# Patient Record
Sex: Female | Born: 1950
Health system: Southern US, Community
[De-identification: ages and names within clinical notes are randomized; demographics above are authoritative.]

## PROBLEM LIST (undated history)

## (undated) DIAGNOSIS — E7800 Pure hypercholesterolemia, unspecified: Secondary | ICD-10-CM

## (undated) DIAGNOSIS — I2510 Atherosclerotic heart disease of native coronary artery without angina pectoris: Secondary | ICD-10-CM

---

## 1999-06-23 ENCOUNTER — Encounter: Admission: RE | Admit: 1999-06-23 | Discharge: 1999-06-23 | Payer: Self-pay | Admitting: Orthopedic Surgery

## 1999-06-23 ENCOUNTER — Encounter: Payer: Self-pay | Admitting: Orthopedic Surgery

## 1999-07-09 ENCOUNTER — Ambulatory Visit (HOSPITAL_COMMUNITY): Admission: RE | Admit: 1999-07-09 | Discharge: 1999-07-09 | Payer: Self-pay | Admitting: Orthopedic Surgery

## 1999-11-02 ENCOUNTER — Emergency Department (HOSPITAL_COMMUNITY): Admission: EM | Admit: 1999-11-02 | Discharge: 1999-11-02 | Payer: Self-pay | Admitting: Emergency Medicine

## 1999-11-02 ENCOUNTER — Encounter: Payer: Self-pay | Admitting: Emergency Medicine

## 1999-12-01 ENCOUNTER — Encounter: Payer: Self-pay | Admitting: General Surgery

## 1999-12-01 ENCOUNTER — Encounter (INDEPENDENT_AMBULATORY_CARE_PROVIDER_SITE_OTHER): Payer: Self-pay | Admitting: Specialist

## 1999-12-01 ENCOUNTER — Observation Stay (HOSPITAL_COMMUNITY): Admission: RE | Admit: 1999-12-01 | Discharge: 1999-12-02 | Payer: Self-pay | Admitting: General Surgery

## 2002-03-04 ENCOUNTER — Emergency Department (HOSPITAL_COMMUNITY): Admission: EM | Admit: 2002-03-04 | Discharge: 2002-03-04 | Payer: Self-pay | Admitting: Emergency Medicine

## 2002-03-04 ENCOUNTER — Encounter: Payer: Self-pay | Admitting: Emergency Medicine

## 2008-09-30 ENCOUNTER — Encounter: Admission: RE | Admit: 2008-09-30 | Discharge: 2008-09-30 | Payer: Self-pay | Admitting: Family Medicine

## 2010-10-22 NOTE — Op Note (Signed)
[place]  Patient:    [name], [name]                      MRN: [id number] Proc. Date: 12/01/99 Adm. Date:  [id number] Disc. Date: [id number] Attending:  Glenna Fellows Tappan                           Operative Report  PREOPERATIVE DIAGNOSIS:  Symptomatic cholelithiasis.  POSTOPERATIVE DIAGNOSIS:  Symptomatic cholelithiasis.  SURGICAL PROCEDURE:  Laparoscopic cholecystectomy.  SURGEON:  Lorne Skeens. Hoxworth, M.D.  ASSISTANT:  Donnie Coffin. Samuella Cota, M.D.  ANESTHESIA:  General.  BRIEF HISTORY:  Anne Gibbs is a 60 year old white female who recently presented to the emergency room with an acute episode of epigastric and right flank pain.  A CT of the abdomen and chest was obtained at that time which revealed cholelithiasis and no other abnormalities.  The LFTs were normal. The patient was felt to have symptomatic cholelithiasis and laparoscopic cholecystectomy has been recommended and accepted.  The nature of the procedure, its indications and risks of bleeding, infection, bile leak and bile duct injury were discussed and understood preoperatively.  She is now brought to the operating room for this procedure.  DESCRIPTION OF OPERATION:  Patient was brought to the operating room and placed in supine position on the operating table and general endotracheal anesthesia was induced.  Broad-spectrum antibiotics had been given preoperatively.  The abdomen was sterilely prepped and draped.  PAS were in place.  Local anesthetic was used to infiltrate the trocar sites prior to the incisions.  A 1-cm incision was made at the umbilicus and dissection carried down to the midline fascia.  This was sharply incised for 1 cm and the peritoneum entered under direct vision.  Through a mattress suture of 0 Vicryl, the Hasson trocar was placed and pneumoperitoneum established. Under direct vision, a 10-mm trocar was placed in the subxiphoid area and two 5-mm  trocars along the right subcostal margin.  There were some adhesions of the omentum to the anterior abdominal wall and the right upper quadrant along the inferior edge of the right lobe of the liver.  These were taken down sharply and the gallbladder was exposed.  The fundus was grasped and elevated up over the liver and the infundibulum retracted inferolaterally.  Fibrofatty tissue was stripped from around the neck of the gallbladder toward the porta hepatis.  The distal gallbladder and Calots triangle were thoroughly dissected.  An anterior branch of the cystic artery was seen to be coursing up onto the gallbladder wall and when the anatomy was clear, it was doubly clipped proximally, clipped distally and divided.  The cystic duct was dissected free over about a centimeter and the cystic duct-gallbladder junction dissected 360 degrees.  Calots triangle was dissected well up onto the posterior gallbladder wall and a small posterior branch of the cystic artery was the only structure remaining.  When the anatomy was clear, the cystic duct was doubly clipped proximally, clipped distally and divided.  The posterior branch of the cystic artery was similarly clipped and divided.  The gallbladder was then dissected free from its bed using hook-and-spatula cautery and removed intact through the umbilicus.  The right upper quadrant was irrigated and suctioned till clear and hemostasis assured.  Trocars were removed under direct vision and all CO2 evacuated from the peritoneal cavity. The pursestring suture was secured at the umbilicus.  Skin incisions  were closed with interrupted subcuticulars of 4-0 Monocryl and Steri-Strips. Sponge, needle and instrument count were correct.  Dry sterile dressings were applied and the patient taken to recovery in good condition.DD:  12/01/99 TD:  12/02/99 Job: [id number]

## 2014-01-15 ENCOUNTER — Encounter: Payer: Self-pay | Admitting: Podiatrist

## 2014-01-15 ENCOUNTER — Ambulatory Visit (INDEPENDENT_AMBULATORY_CARE_PROVIDER_SITE_OTHER): Payer: BC Managed Care – PPO | Admitting: Podiatrist

## 2014-01-15 ENCOUNTER — Ambulatory Visit (INDEPENDENT_AMBULATORY_CARE_PROVIDER_SITE_OTHER): Payer: BC Managed Care – PPO

## 2014-01-15 VITALS — BP 141/74 | HR 72 | Resp 18

## 2014-01-15 DIAGNOSIS — R52 Pain, unspecified: Secondary | ICD-10-CM

## 2014-01-15 DIAGNOSIS — S92919A Unspecified fracture of unspecified toe(s), initial encounter for closed fracture: Secondary | ICD-10-CM

## 2014-01-15 DIAGNOSIS — M2030 Hallux varus (acquired), unspecified foot: Secondary | ICD-10-CM

## 2014-01-15 DIAGNOSIS — S92911A Unspecified fracture of right toe(s), initial encounter for closed fracture: Secondary | ICD-10-CM

## 2014-01-15 NOTE — Patient Instructions (Addendum)
You have a Hallux Varus Deformity-- due to the severity of the deformity a Fusion of the great toe joint is the best option to treat this.  You will need to keep complete weight off of your foot 4 weeks after surgery and will be in a boot or shoe up to 8 weeks post operatively.  Please call if you would like to schedule the surgery-- it is performed outpatient at [facility].   Toe Fracture Your caregiver has diagnosed you as having a fractured toe. A toe fracture is a break in the bone of a toe. "Buddy taping" is a way of splinting your broken toe, by taping the broken toe to the toe next to it. This "buddy taping" will keep the injured toe from moving beyond normal range of motion. Buddy taping also helps the toe heal in a more normal alignment. It may take 6 to 8 weeks for the toe injury to heal. [place] your toes taped together for as long as directed by your caregiver or until you see a doctor for a follow-up examination. You can change the tape after bathing. Always use a small piece of gauze or cotton between the toes when taping them together. This will help the skin stay dry and prevent infection.  Apply ice to the injury for 15-20 minutes each hour while awake for the first 2 days. Put the ice in a plastic bag and place a towel between the bag of ice and your skin.  After the first 2 days, apply heat to the injured area. Use heat for the next 2 to 3 days. Place a heating pad on the foot or soak the foot in warm water as directed by your caregiver.  Keep your foot elevated as much as possible to lessen swelling.  Wear sturdy, supportive shoes. The shoes should not pinch the toes or fit tightly against the toes.  Your caregiver may prescribe a rigid shoe if your foot is very swollen.  Your may be given crutches if the pain is too great and it hurts too much to walk.  Only take over-the-counter or prescription medicines for pain,  discomfort, or fever as directed by your caregiver.  If your caregiver has given you a follow-up appointment, it is very important to keep that appointment. Not keeping the appointment could result in a chronic or permanent injury, pain, and disability. If there is any problem keeping the appointment, you must call back to this facility for assistance. SEEK MEDICAL CARE IF:   You have increased pain or swelling, not relieved with medications.  The pain does not get better after 1 week.  Your injured toe is cold when the others are warm. SEEK IMMEDIATE MEDICAL CARE IF:   The toe becomes cold, numb, or white.  The toe becomes hot (inflamed) and red. Document Released: 05/20/2000 Document Revised: 08/15/2011 Document Reviewed: 01/07/2008 [facility] Patient Information 2015 [place], [place]. This information is not intended to replace advice given to you by your health care provider. Make sure you discuss any questions you have with your health care provider.

## 2014-01-15 NOTE — Progress Notes (Signed)
   Subjective:    Patient ID: Anne Gibbs, female    DOB: 1951-02-03, 63 y.o.   MRN: [id number]  HPImy right big toe is turning out and has been going on for about a year and rubs against shoes and my little toe stumped it on the end of the bed and was about a month ago and it was black and the side of foot is sore    Review of Systems  All other systems reviewed and are negative.      Objective:   Physical Exam  Patient is awake, alert, and oriented x 3.  In no acute distress.  Vascular status is intact with palpable pedal pulses at 2/4 DP and PT bilateral and capillary refill time within normal limits. Neurological sensation is also intact bilaterally via Semmes Weinstein monofilament at 5/5 sites. Light touch, vibratory sensation, Achilles tendon reflex is intact. Dermatological exam reveals skin color, turger and texture as normal. No open lesions present.  Musculature intact with dorsiflexion, plantarflexion, inversion, eversion.  Swelling at the fifth digit of the right foot is noted. Severe hallux varus deformity is noted with medial deviation of lesser digits. Pain with range of motion is also noted. X-ray show severe hallux varus deformity and a fracture of the fifth digit of the right foot.    Assessment & Plan:  Hallux varus deformity, fracture fifth digit right foot nondisplaced  Plan: Recommended that she continue to ice the right foot at the fifth digit. We discussed the hallux varus deformity and I recommended a fusion of the first metatarsophalangeal joint. We discussed the specifics of the postoperative course and that she would need to take off of work 4-8 weeks for healing. She would like to consider her options and she will call if she would like to pursue surgery.

## 2014-03-19 ENCOUNTER — Encounter: Payer: Self-pay | Admitting: *Deleted

## 2014-07-11 ENCOUNTER — Encounter (HOSPITAL_COMMUNITY): Payer: Self-pay | Admitting: *Deleted

## 2014-07-11 ENCOUNTER — Emergency Department (HOSPITAL_COMMUNITY): Payer: BLUE CROSS/BLUE SHIELD

## 2014-07-11 ENCOUNTER — Emergency Department (HOSPITAL_COMMUNITY)
Admission: EM | Admit: 2014-07-11 | Discharge: 2014-07-11 | Disposition: A | Discharge status: Home / Self Care | Payer: BLUE CROSS/BLUE SHIELD | Attending: Emergency Medicine | Admitting: Emergency Medicine

## 2014-07-11 DIAGNOSIS — S29001A Unspecified injury of muscle and tendon of front wall of thorax, initial encounter: Secondary | ICD-10-CM | POA: Diagnosis present

## 2014-07-11 DIAGNOSIS — S20212A Contusion of left front wall of thorax, initial encounter: Secondary | ICD-10-CM | POA: Diagnosis not present

## 2014-07-11 DIAGNOSIS — E780 Pure hypercholesterolemia: Secondary | ICD-10-CM | POA: Insufficient documentation

## 2014-07-11 DIAGNOSIS — Y998 Other external cause status: Secondary | ICD-10-CM | POA: Diagnosis not present

## 2014-07-11 DIAGNOSIS — F419 Anxiety disorder, unspecified: Secondary | ICD-10-CM | POA: Insufficient documentation

## 2014-07-11 DIAGNOSIS — Z79899 Other long term (current) drug therapy: Secondary | ICD-10-CM | POA: Diagnosis not present

## 2014-07-11 DIAGNOSIS — Y9389 Activity, other specified: Secondary | ICD-10-CM | POA: Insufficient documentation

## 2014-07-11 DIAGNOSIS — Y92410 Unspecified street and highway as the place of occurrence of the external cause: Secondary | ICD-10-CM | POA: Insufficient documentation

## 2014-07-11 HISTORY — DX: Pure hypercholesterolemia, unspecified: E78.00

## 2014-07-11 MED ORDER — ACETAMINOPHEN 325 MG PO TABS
650.0000 mg | ORAL_TABLET | Freq: Once | ORAL | Status: AC
  Administered 2014-07-11: 650 mg via ORAL
  Filled 2014-07-11: qty 2

## 2014-07-11 MED ORDER — HYDROCODONE-ACETAMINOPHEN 5-325 MG PO TABS: 1.0000 | ORAL_TABLET | Freq: Four times a day (QID) | ORAL | Status: DC | PRN

## 2014-07-11 MED ORDER — CYCLOBENZAPRINE HCL 10 MG PO TABS: 5.0000 mg | ORAL_TABLET | Freq: Two times a day (BID) | ORAL | Status: DC | PRN
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
## 2014-11-07 ENCOUNTER — Other Ambulatory Visit: Payer: Self-pay | Admitting: Obstetrics and Gynecology

## 2014-11-07 DIAGNOSIS — R928 Other abnormal and inconclusive findings on diagnostic imaging of breast: Secondary | ICD-10-CM

## 2014-11-24 ENCOUNTER — Ambulatory Visit
Admission: RE | Admit: 2014-11-24 | Discharge: 2014-11-24 | Disposition: A | Discharge status: Home / Self Care | Payer: BLUE CROSS/BLUE SHIELD | Source: Ambulatory Visit | Attending: Obstetrics and Gynecology | Admitting: Obstetrics and Gynecology

## 2014-11-24 ENCOUNTER — Other Ambulatory Visit: Payer: Self-pay | Admitting: Obstetrics and Gynecology

## 2014-11-24 DIAGNOSIS — R928 Other abnormal and inconclusive findings on diagnostic imaging of breast: Secondary | ICD-10-CM

## 2014-11-24 DIAGNOSIS — N6320 Unspecified lump in the left breast, unspecified quadrant: Secondary | ICD-10-CM

## 2014-11-25 ENCOUNTER — Other Ambulatory Visit: Payer: Self-pay | Admitting: Obstetrics and Gynecology

## 2014-11-25 DIAGNOSIS — N6320 Unspecified lump in the left breast, unspecified quadrant: Secondary | ICD-10-CM

## 2014-11-27 ENCOUNTER — Other Ambulatory Visit: Payer: Self-pay | Admitting: Obstetrics and Gynecology

## 2014-11-27 ENCOUNTER — Ambulatory Visit
Admission: RE | Admit: 2014-11-27 | Discharge: 2014-11-27 | Disposition: A | Discharge status: Home / Self Care | Payer: BLUE CROSS/BLUE SHIELD | Source: Ambulatory Visit | Attending: Obstetrics and Gynecology | Admitting: Obstetrics and Gynecology

## 2014-11-27 DIAGNOSIS — N6320 Unspecified lump in the left breast, unspecified quadrant: Secondary | ICD-10-CM

## 2015-06-07 HISTORY — PX: BREAST BIOPSY: SHX20

## 2015-10-26 DIAGNOSIS — E782 Mixed hyperlipidemia: Secondary | ICD-10-CM | POA: Diagnosis not present

## 2015-10-26 DIAGNOSIS — R6889 Other general symptoms and signs: Secondary | ICD-10-CM | POA: Diagnosis not present

## 2015-10-26 DIAGNOSIS — M1712 Unilateral primary osteoarthritis, left knee: Secondary | ICD-10-CM | POA: Diagnosis not present

## 2015-11-06 DIAGNOSIS — E785 Hyperlipidemia, unspecified: Secondary | ICD-10-CM | POA: Diagnosis not present

## 2015-11-06 DIAGNOSIS — R9430 Abnormal result of cardiovascular function study, unspecified: Secondary | ICD-10-CM | POA: Diagnosis not present

## 2015-11-06 DIAGNOSIS — Z8249 Family history of ischemic heart disease and other diseases of the circulatory system: Secondary | ICD-10-CM | POA: Diagnosis not present

## 2015-11-06 DIAGNOSIS — I739 Peripheral vascular disease, unspecified: Secondary | ICD-10-CM | POA: Diagnosis not present

## 2016-02-26 DIAGNOSIS — Z23 Encounter for immunization: Secondary | ICD-10-CM | POA: Diagnosis not present

## 2016-03-28 DIAGNOSIS — Z1231 Encounter for screening mammogram for malignant neoplasm of breast: Secondary | ICD-10-CM | POA: Diagnosis not present

## 2016-03-28 DIAGNOSIS — N816 Rectocele: Secondary | ICD-10-CM | POA: Diagnosis not present

## 2016-03-28 DIAGNOSIS — N8111 Cystocele, midline: Secondary | ICD-10-CM | POA: Diagnosis not present

## 2016-03-28 DIAGNOSIS — Z1389 Encounter for screening for other disorder: Secondary | ICD-10-CM | POA: Diagnosis not present

## 2016-03-28 DIAGNOSIS — Z130 Encounter for screening for diseases of the blood and blood-forming organs and certain disorders involving the immune mechanism: Secondary | ICD-10-CM | POA: Diagnosis not present

## 2016-03-28 DIAGNOSIS — Z124 Encounter for screening for malignant neoplasm of cervix: Secondary | ICD-10-CM | POA: Diagnosis not present

## 2016-03-28 DIAGNOSIS — Z01419 Encounter for gynecological examination (general) (routine) without abnormal findings: Secondary | ICD-10-CM | POA: Diagnosis not present

## 2016-03-30 ENCOUNTER — Other Ambulatory Visit: Payer: Self-pay | Admitting: Obstetrics and Gynecology

## 2016-03-30 DIAGNOSIS — R928 Other abnormal and inconclusive findings on diagnostic imaging of breast: Secondary | ICD-10-CM

## 2016-04-07 ENCOUNTER — Ambulatory Visit
Admission: RE | Admit: 2016-04-07 | Discharge: 2016-04-07 | Disposition: A | Discharge status: Home / Self Care | Payer: Medicare Other | Source: Ambulatory Visit | Attending: Obstetrics and Gynecology | Admitting: Obstetrics and Gynecology

## 2016-04-07 ENCOUNTER — Other Ambulatory Visit: Payer: Self-pay | Admitting: Obstetrics and Gynecology

## 2016-04-07 DIAGNOSIS — R928 Other abnormal and inconclusive findings on diagnostic imaging of breast: Secondary | ICD-10-CM

## 2016-04-07 DIAGNOSIS — R921 Mammographic calcification found on diagnostic imaging of breast: Secondary | ICD-10-CM | POA: Diagnosis not present

## 2016-04-08 ENCOUNTER — Other Ambulatory Visit: Payer: Self-pay | Admitting: Obstetrics and Gynecology

## 2016-04-08 ENCOUNTER — Ambulatory Visit
Admission: RE | Admit: 2016-04-08 | Discharge: 2016-04-08 | Disposition: A | Discharge status: Home / Self Care | Payer: Medicare Other | Source: Ambulatory Visit | Attending: Obstetrics and Gynecology | Admitting: Obstetrics and Gynecology

## 2016-04-08 DIAGNOSIS — R928 Other abnormal and inconclusive findings on diagnostic imaging of breast: Secondary | ICD-10-CM

## 2016-04-08 DIAGNOSIS — N6012 Diffuse cystic mastopathy of left breast: Secondary | ICD-10-CM | POA: Diagnosis not present

## 2016-04-08 DIAGNOSIS — N641 Fat necrosis of breast: Secondary | ICD-10-CM | POA: Diagnosis not present

## 2016-04-08 DIAGNOSIS — R921 Mammographic calcification found on diagnostic imaging of breast: Secondary | ICD-10-CM | POA: Diagnosis not present

## 2016-04-19 ENCOUNTER — Ambulatory Visit (INDEPENDENT_AMBULATORY_CARE_PROVIDER_SITE_OTHER)
Admission: RE | Admit: 2016-04-19 | Discharge: 2016-04-19 | Disposition: A | Discharge status: Home / Self Care | Payer: Medicare Other | Source: Ambulatory Visit | Attending: Vascular Surgery | Admitting: Vascular Surgery

## 2016-04-19 ENCOUNTER — Ambulatory Visit (HOSPITAL_COMMUNITY)
Admission: RE | Admit: 2016-04-19 | Discharge: 2016-04-19 | Disposition: A | Discharge status: Home / Self Care | Payer: Medicare Other | Source: Ambulatory Visit | Attending: Vascular Surgery | Admitting: Vascular Surgery

## 2016-04-19 ENCOUNTER — Other Ambulatory Visit: Payer: Self-pay | Admitting: Cardiology

## 2016-04-19 DIAGNOSIS — I739 Peripheral vascular disease, unspecified: Secondary | ICD-10-CM | POA: Insufficient documentation

## 2016-05-02 DIAGNOSIS — Z13820 Encounter for screening for osteoporosis: Secondary | ICD-10-CM | POA: Diagnosis not present

## 2016-05-02 DIAGNOSIS — Z23 Encounter for immunization: Secondary | ICD-10-CM | POA: Diagnosis not present

## 2016-05-02 DIAGNOSIS — R6889 Other general symptoms and signs: Secondary | ICD-10-CM | POA: Diagnosis not present

## 2016-05-02 DIAGNOSIS — E782 Mixed hyperlipidemia: Secondary | ICD-10-CM | POA: Diagnosis not present

## 2016-05-02 DIAGNOSIS — M1712 Unilateral primary osteoarthritis, left knee: Secondary | ICD-10-CM | POA: Diagnosis not present

## 2016-06-21 DIAGNOSIS — M8588 Other specified disorders of bone density and structure, other site: Secondary | ICD-10-CM | POA: Diagnosis not present

## 2016-06-21 DIAGNOSIS — Z13820 Encounter for screening for osteoporosis: Secondary | ICD-10-CM | POA: Diagnosis not present

## 2016-06-21 DIAGNOSIS — Z780 Asymptomatic menopausal state: Secondary | ICD-10-CM | POA: Diagnosis not present

## 2016-11-15 DIAGNOSIS — Z1211 Encounter for screening for malignant neoplasm of colon: Secondary | ICD-10-CM | POA: Diagnosis not present

## 2016-11-15 DIAGNOSIS — R030 Elevated blood-pressure reading, without diagnosis of hypertension: Secondary | ICD-10-CM | POA: Diagnosis not present

## 2016-11-15 DIAGNOSIS — Z1389 Encounter for screening for other disorder: Secondary | ICD-10-CM | POA: Diagnosis not present

## 2016-11-15 DIAGNOSIS — K219 Gastro-esophageal reflux disease without esophagitis: Secondary | ICD-10-CM | POA: Diagnosis not present

## 2016-11-15 DIAGNOSIS — Z0000 Encounter for general adult medical examination without abnormal findings: Secondary | ICD-10-CM | POA: Diagnosis not present

## 2016-11-15 DIAGNOSIS — F439 Reaction to severe stress, unspecified: Secondary | ICD-10-CM | POA: Diagnosis not present

## 2016-11-15 DIAGNOSIS — G4730 Sleep apnea, unspecified: Secondary | ICD-10-CM | POA: Diagnosis not present

## 2016-11-15 DIAGNOSIS — Z1231 Encounter for screening mammogram for malignant neoplasm of breast: Secondary | ICD-10-CM | POA: Diagnosis not present

## 2016-11-15 DIAGNOSIS — E559 Vitamin D deficiency, unspecified: Secondary | ICD-10-CM | POA: Diagnosis not present

## 2016-11-15 DIAGNOSIS — M8580 Other specified disorders of bone density and structure, unspecified site: Secondary | ICD-10-CM | POA: Diagnosis not present

## 2016-11-15 DIAGNOSIS — E782 Mixed hyperlipidemia: Secondary | ICD-10-CM | POA: Diagnosis not present

## 2016-11-21 ENCOUNTER — Other Ambulatory Visit: Payer: Self-pay | Admitting: Obstetrics and Gynecology

## 2016-11-21 DIAGNOSIS — R921 Mammographic calcification found on diagnostic imaging of breast: Secondary | ICD-10-CM

## 2016-11-21 DIAGNOSIS — Z1231 Encounter for screening mammogram for malignant neoplasm of breast: Secondary | ICD-10-CM

## 2016-12-05 ENCOUNTER — Ambulatory Visit
Admission: RE | Admit: 2016-12-05 | Discharge: 2016-12-05 | Disposition: A | Discharge status: Home / Self Care | Payer: Medicare Other | Source: Ambulatory Visit | Attending: Obstetrics and Gynecology | Admitting: Obstetrics and Gynecology

## 2016-12-05 DIAGNOSIS — R928 Other abnormal and inconclusive findings on diagnostic imaging of breast: Secondary | ICD-10-CM | POA: Diagnosis not present

## 2016-12-05 DIAGNOSIS — R921 Mammographic calcification found on diagnostic imaging of breast: Secondary | ICD-10-CM

## 2017-03-07 DIAGNOSIS — Z23 Encounter for immunization: Secondary | ICD-10-CM | POA: Diagnosis not present

## 2017-03-29 ENCOUNTER — Ambulatory Visit
Admission: RE | Admit: 2017-03-29 | Discharge: 2017-03-29 | Disposition: A | Discharge status: Home / Self Care | Payer: Medicare Other | Source: Ambulatory Visit | Attending: Obstetrics and Gynecology | Admitting: Obstetrics and Gynecology

## 2017-03-29 DIAGNOSIS — Z1231 Encounter for screening mammogram for malignant neoplasm of breast: Secondary | ICD-10-CM | POA: Diagnosis not present

## 2017-03-29 DIAGNOSIS — Z01419 Encounter for gynecological examination (general) (routine) without abnormal findings: Secondary | ICD-10-CM | POA: Diagnosis not present

## 2017-05-19 DIAGNOSIS — Z1211 Encounter for screening for malignant neoplasm of colon: Secondary | ICD-10-CM | POA: Diagnosis not present

## 2017-07-27 DIAGNOSIS — L989 Disorder of the skin and subcutaneous tissue, unspecified: Secondary | ICD-10-CM | POA: Diagnosis not present

## 2017-07-27 DIAGNOSIS — M545 Low back pain: Secondary | ICD-10-CM | POA: Diagnosis not present

## 2017-07-27 DIAGNOSIS — Z87898 Personal history of other specified conditions: Secondary | ICD-10-CM | POA: Diagnosis not present

## 2017-11-09 DIAGNOSIS — Z1389 Encounter for screening for other disorder: Secondary | ICD-10-CM | POA: Diagnosis not present

## 2017-11-09 DIAGNOSIS — K219 Gastro-esophageal reflux disease without esophagitis: Secondary | ICD-10-CM | POA: Diagnosis not present

## 2017-11-09 DIAGNOSIS — M8588 Other specified disorders of bone density and structure, other site: Secondary | ICD-10-CM | POA: Diagnosis not present

## 2017-11-09 DIAGNOSIS — G4730 Sleep apnea, unspecified: Secondary | ICD-10-CM | POA: Diagnosis not present

## 2017-11-09 DIAGNOSIS — E782 Mixed hyperlipidemia: Secondary | ICD-10-CM | POA: Diagnosis not present

## 2017-11-09 DIAGNOSIS — E559 Vitamin D deficiency, unspecified: Secondary | ICD-10-CM | POA: Diagnosis not present

## 2017-11-09 DIAGNOSIS — L989 Disorder of the skin and subcutaneous tissue, unspecified: Secondary | ICD-10-CM | POA: Diagnosis not present

## 2018-01-18 DIAGNOSIS — C4441 Basal cell carcinoma of skin of scalp and neck: Secondary | ICD-10-CM | POA: Diagnosis not present

## 2018-01-18 DIAGNOSIS — D485 Neoplasm of uncertain behavior of skin: Secondary | ICD-10-CM | POA: Diagnosis not present

## 2018-03-06 DIAGNOSIS — D225 Melanocytic nevi of trunk: Secondary | ICD-10-CM | POA: Diagnosis not present

## 2018-03-06 DIAGNOSIS — D1801 Hemangioma of skin and subcutaneous tissue: Secondary | ICD-10-CM | POA: Diagnosis not present

## 2018-03-06 DIAGNOSIS — Z23 Encounter for immunization: Secondary | ICD-10-CM | POA: Diagnosis not present

## 2018-03-06 DIAGNOSIS — Z85828 Personal history of other malignant neoplasm of skin: Secondary | ICD-10-CM | POA: Diagnosis not present

## 2018-03-06 DIAGNOSIS — L814 Other melanin hyperpigmentation: Secondary | ICD-10-CM | POA: Diagnosis not present

## 2018-03-06 DIAGNOSIS — L821 Other seborrheic keratosis: Secondary | ICD-10-CM | POA: Diagnosis not present

## 2018-03-08 DIAGNOSIS — Z23 Encounter for immunization: Secondary | ICD-10-CM | POA: Diagnosis not present

## 2018-03-15 ENCOUNTER — Other Ambulatory Visit: Payer: Self-pay | Admitting: Obstetrics and Gynecology

## 2018-03-15 DIAGNOSIS — Z1231 Encounter for screening mammogram for malignant neoplasm of breast: Secondary | ICD-10-CM

## 2018-03-29 DIAGNOSIS — C4441 Basal cell carcinoma of skin of scalp and neck: Secondary | ICD-10-CM | POA: Diagnosis not present

## 2018-04-26 ENCOUNTER — Ambulatory Visit
Admission: RE | Admit: 2018-04-26 | Discharge: 2018-04-26 | Disposition: A | Discharge status: Home / Self Care | Payer: Medicare Other | Source: Ambulatory Visit | Attending: Obstetrics and Gynecology | Admitting: Obstetrics and Gynecology

## 2018-04-26 DIAGNOSIS — Z1231 Encounter for screening mammogram for malignant neoplasm of breast: Secondary | ICD-10-CM | POA: Diagnosis not present

## 2018-06-28 DIAGNOSIS — Z1389 Encounter for screening for other disorder: Secondary | ICD-10-CM | POA: Diagnosis not present

## 2018-06-28 DIAGNOSIS — Z85828 Personal history of other malignant neoplasm of skin: Secondary | ICD-10-CM | POA: Diagnosis not present

## 2018-06-28 DIAGNOSIS — K219 Gastro-esophageal reflux disease without esophagitis: Secondary | ICD-10-CM | POA: Diagnosis not present

## 2018-06-28 DIAGNOSIS — G4730 Sleep apnea, unspecified: Secondary | ICD-10-CM | POA: Diagnosis not present

## 2018-06-28 DIAGNOSIS — M8588 Other specified disorders of bone density and structure, other site: Secondary | ICD-10-CM | POA: Diagnosis not present

## 2018-06-28 DIAGNOSIS — Z0000 Encounter for general adult medical examination without abnormal findings: Secondary | ICD-10-CM | POA: Diagnosis not present

## 2018-06-28 DIAGNOSIS — J018 Other acute sinusitis: Secondary | ICD-10-CM | POA: Diagnosis not present

## 2018-06-28 DIAGNOSIS — E782 Mixed hyperlipidemia: Secondary | ICD-10-CM | POA: Diagnosis not present

## 2018-06-28 DIAGNOSIS — Z1211 Encounter for screening for malignant neoplasm of colon: Secondary | ICD-10-CM | POA: Diagnosis not present

## 2018-06-28 DIAGNOSIS — R030 Elevated blood-pressure reading, without diagnosis of hypertension: Secondary | ICD-10-CM | POA: Diagnosis not present

## 2018-06-28 DIAGNOSIS — E559 Vitamin D deficiency, unspecified: Secondary | ICD-10-CM | POA: Diagnosis not present

## 2018-07-10 DIAGNOSIS — Z780 Asymptomatic menopausal state: Secondary | ICD-10-CM | POA: Diagnosis not present

## 2018-07-10 DIAGNOSIS — M8589 Other specified disorders of bone density and structure, multiple sites: Secondary | ICD-10-CM | POA: Diagnosis not present

## 2018-08-20 DIAGNOSIS — Z1211 Encounter for screening for malignant neoplasm of colon: Secondary | ICD-10-CM | POA: Diagnosis not present

## 2019-02-24 DIAGNOSIS — Z23 Encounter for immunization: Secondary | ICD-10-CM | POA: Diagnosis not present

## 2019-02-25 IMAGING — MG DIGITAL SCREENING BILATERAL MAMMOGRAM WITH TOMO AND CAD
8 series · 8 of 24 positions shown · non-contrast
Comparison: Previous exam(s).

CLINICAL DATA: Screening.

EXAM:
DIGITAL SCREENING BILATERAL MAMMOGRAM WITH TOMO AND CAD

[R CC synth-2D]
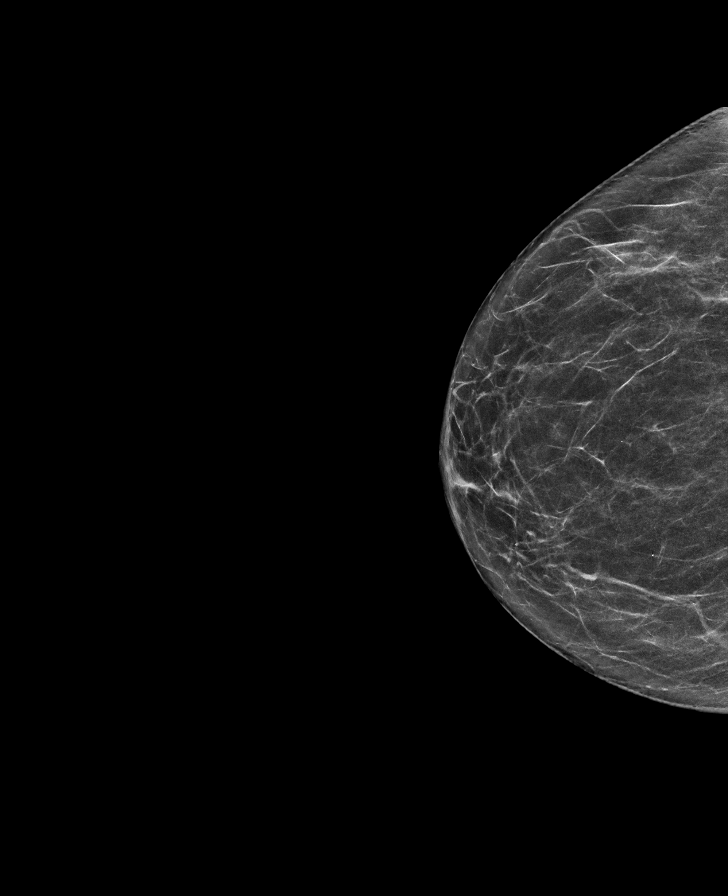

[L MLO synth-2D]
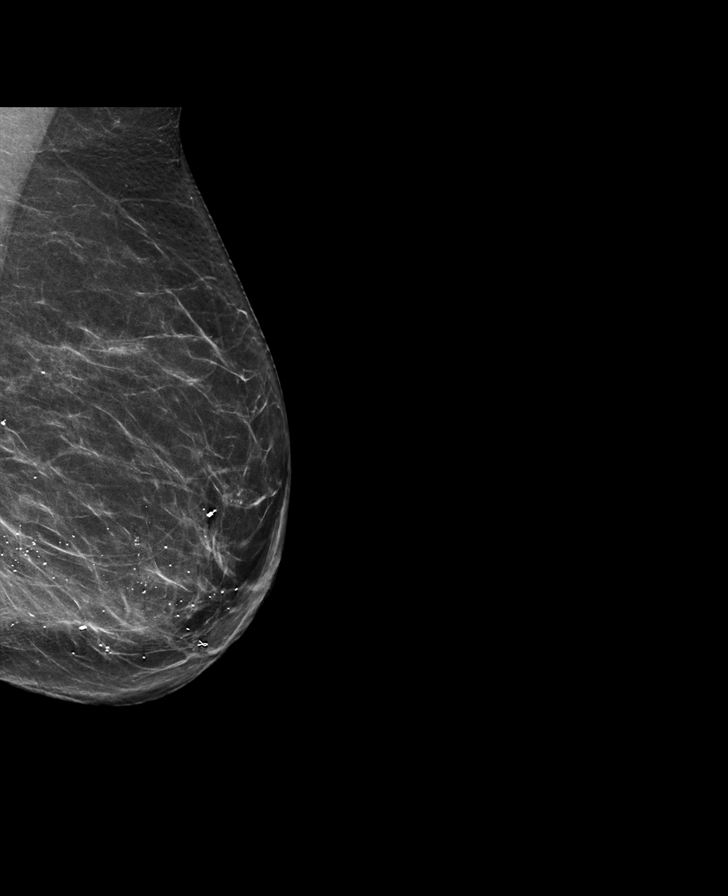

[R MLO synth-2D]
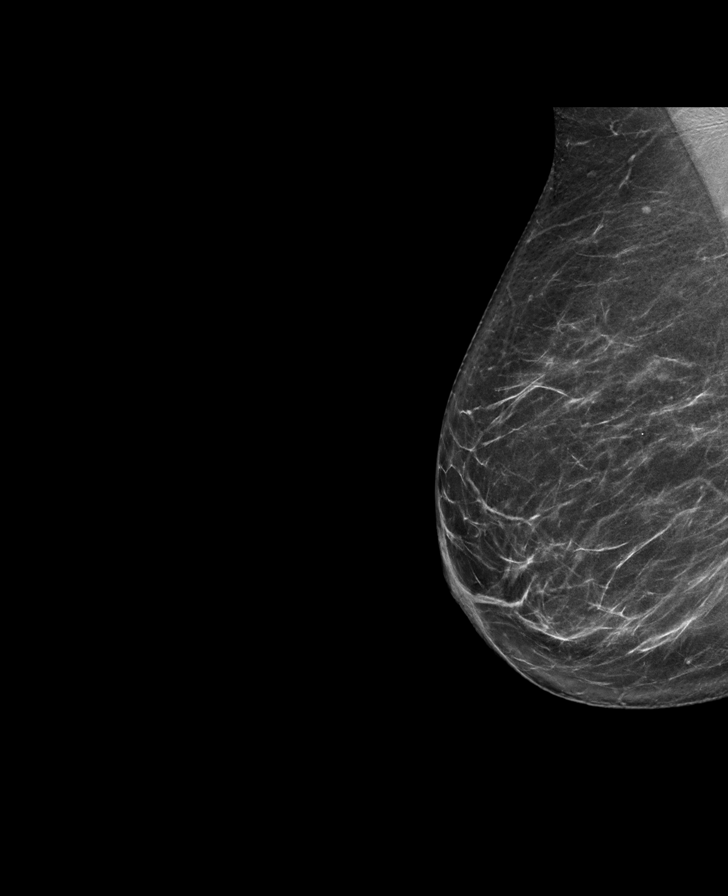

[L CC synth-2D]
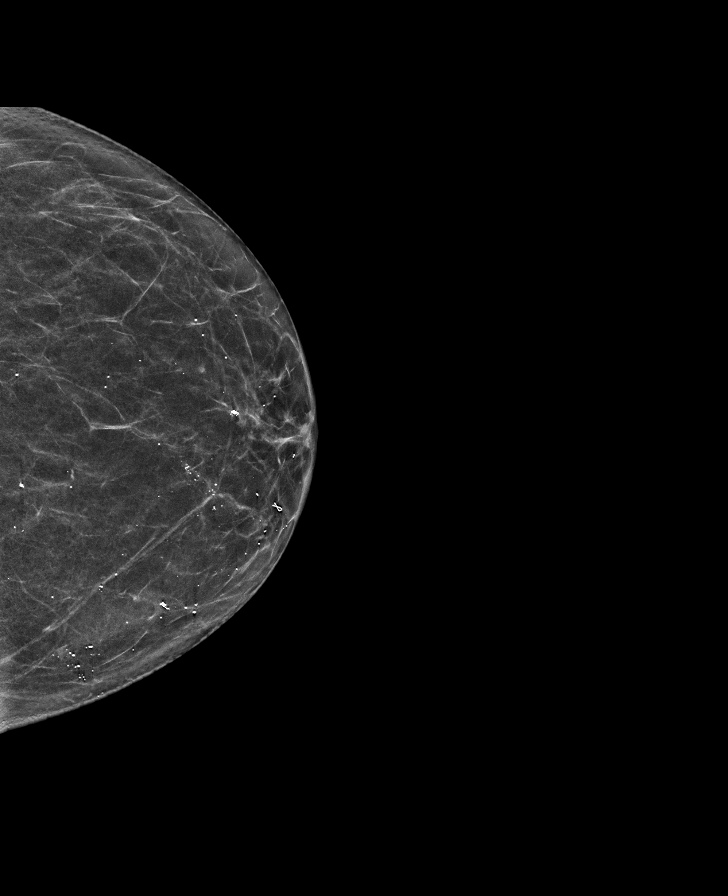

[L MLO tomo · tomo slice 37/72.0]
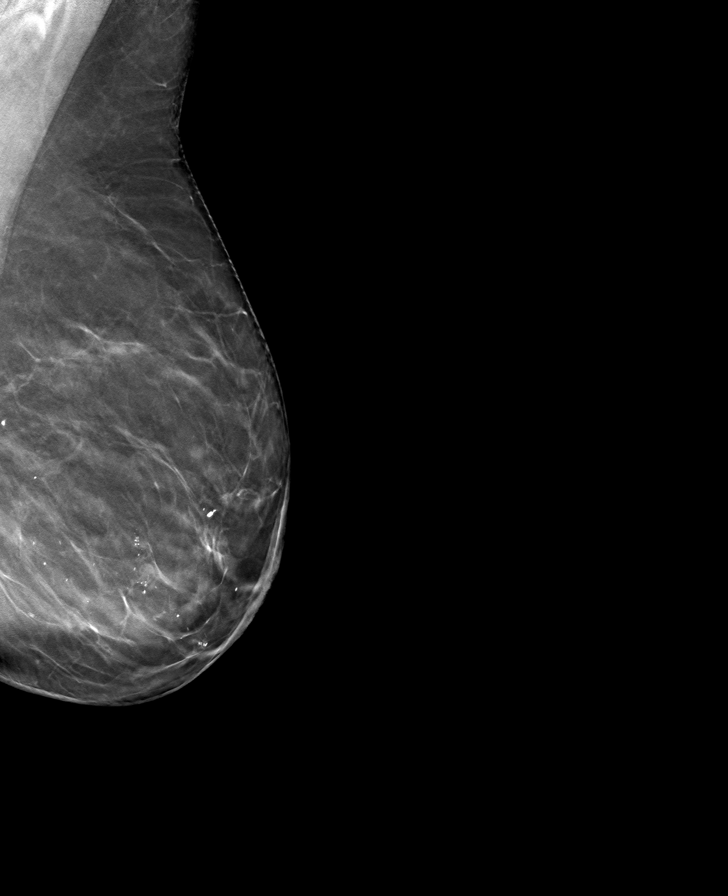

[L CC tomo · tomo slice 31/61.0]
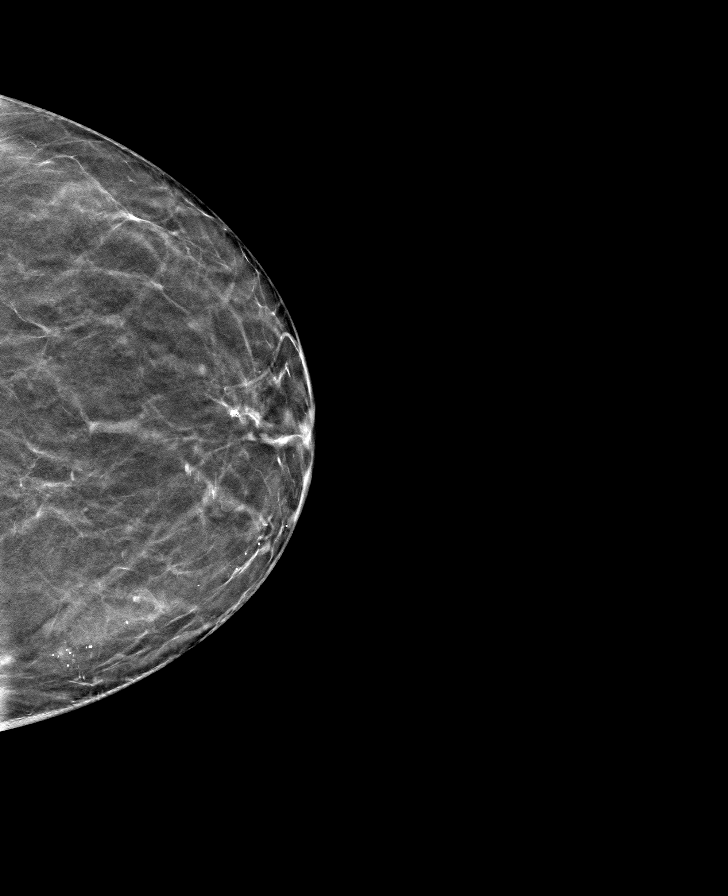

[R CC tomo · tomo slice 31/62.0]
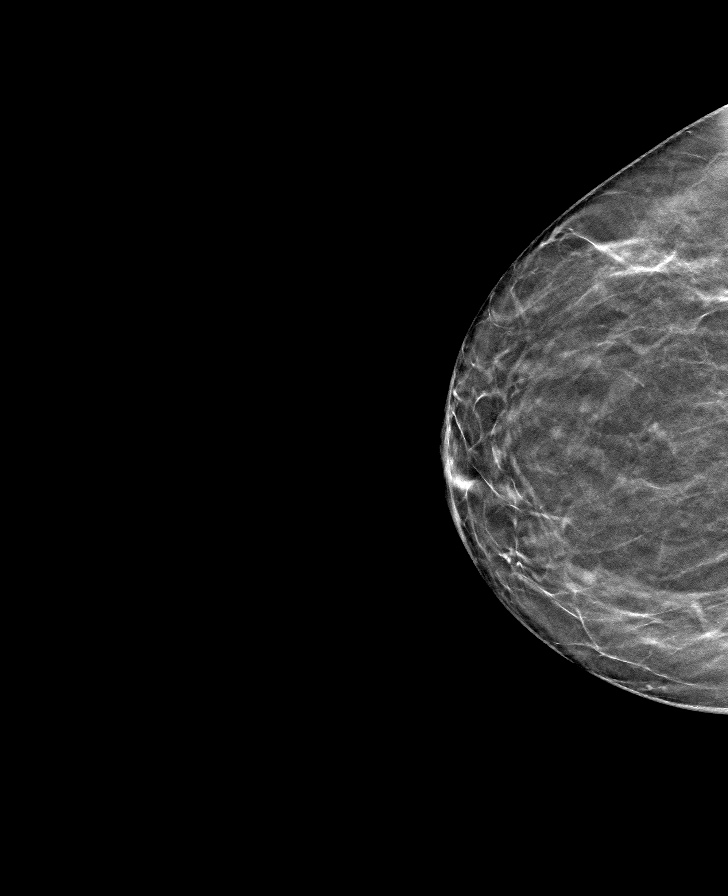

[R MLO tomo · tomo slice 35/70.0]
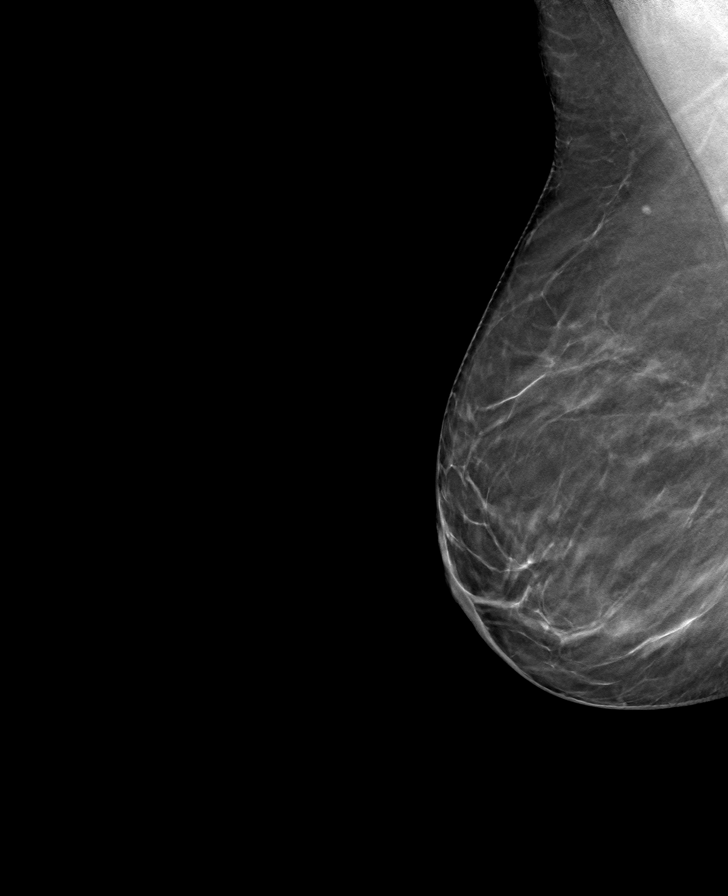

[8 of 24 positions shown; findings below may reference images not displayed]

ACR Breast Density Category b: There are scattered areas of
fibroglandular density.
FINDINGS: There are no findings suspicious for malignancy. Images were
processed with CAD.
IMPRESSION: No mammographic evidence of malignancy. A result letter of this
screening mammogram will be mailed directly to the patient.

RECOMMENDATION:
Screening mammogram in one year. (Code:[id number])

BI-RADS CATEGORY  1: Negative.

## 2019-03-29 ENCOUNTER — Other Ambulatory Visit: Payer: Self-pay | Admitting: Obstetrics and Gynecology

## 2019-03-29 DIAGNOSIS — Z1231 Encounter for screening mammogram for malignant neoplasm of breast: Secondary | ICD-10-CM

## 2019-05-15 DIAGNOSIS — Z01419 Encounter for gynecological examination (general) (routine) without abnormal findings: Secondary | ICD-10-CM | POA: Diagnosis not present

## 2019-05-20 ENCOUNTER — Ambulatory Visit
Admission: RE | Admit: 2019-05-20 | Discharge: 2019-05-20 | Disposition: A | Discharge status: Home / Self Care | Payer: Medicare Other | Source: Ambulatory Visit | Attending: Obstetrics and Gynecology | Admitting: Obstetrics and Gynecology

## 2019-05-20 ENCOUNTER — Other Ambulatory Visit: Payer: Self-pay

## 2019-05-20 DIAGNOSIS — Z1231 Encounter for screening mammogram for malignant neoplasm of breast: Secondary | ICD-10-CM | POA: Diagnosis not present

## 2019-07-11 DIAGNOSIS — Z1211 Encounter for screening for malignant neoplasm of colon: Secondary | ICD-10-CM | POA: Diagnosis not present

## 2019-07-11 DIAGNOSIS — K219 Gastro-esophageal reflux disease without esophagitis: Secondary | ICD-10-CM | POA: Diagnosis not present

## 2019-07-11 DIAGNOSIS — E559 Vitamin D deficiency, unspecified: Secondary | ICD-10-CM | POA: Diagnosis not present

## 2019-07-11 DIAGNOSIS — E782 Mixed hyperlipidemia: Secondary | ICD-10-CM | POA: Diagnosis not present

## 2019-07-11 DIAGNOSIS — Z85828 Personal history of other malignant neoplasm of skin: Secondary | ICD-10-CM | POA: Diagnosis not present

## 2019-07-11 DIAGNOSIS — M8588 Other specified disorders of bone density and structure, other site: Secondary | ICD-10-CM | POA: Diagnosis not present

## 2019-07-11 DIAGNOSIS — Z0000 Encounter for general adult medical examination without abnormal findings: Secondary | ICD-10-CM | POA: Diagnosis not present

## 2019-07-11 DIAGNOSIS — M545 Low back pain: Secondary | ICD-10-CM | POA: Diagnosis not present

## 2019-07-11 DIAGNOSIS — R030 Elevated blood-pressure reading, without diagnosis of hypertension: Secondary | ICD-10-CM | POA: Diagnosis not present

## 2019-07-11 DIAGNOSIS — R0600 Dyspnea, unspecified: Secondary | ICD-10-CM | POA: Diagnosis not present

## 2019-07-11 DIAGNOSIS — Z1389 Encounter for screening for other disorder: Secondary | ICD-10-CM | POA: Diagnosis not present

## 2019-07-11 DIAGNOSIS — G4730 Sleep apnea, unspecified: Secondary | ICD-10-CM | POA: Diagnosis not present

## 2019-07-16 ENCOUNTER — Encounter: Payer: Self-pay | Admitting: Cardiology

## 2019-07-16 ENCOUNTER — Other Ambulatory Visit: Payer: Self-pay

## 2019-07-16 ENCOUNTER — Ambulatory Visit (INDEPENDENT_AMBULATORY_CARE_PROVIDER_SITE_OTHER): Payer: Medicare Other | Admitting: Cardiology

## 2019-07-16 VITALS — BP 139/77 | HR 76 | Ht 62.0 in | Wt 155.6 lb

## 2019-07-16 DIAGNOSIS — Z01812 Encounter for preprocedural laboratory examination: Secondary | ICD-10-CM | POA: Diagnosis not present

## 2019-07-16 DIAGNOSIS — Z8249 Family history of ischemic heart disease and other diseases of the circulatory system: Secondary | ICD-10-CM

## 2019-07-16 DIAGNOSIS — R0600 Dyspnea, unspecified: Secondary | ICD-10-CM

## 2019-07-16 DIAGNOSIS — R0609 Other forms of dyspnea: Secondary | ICD-10-CM

## 2019-07-16 DIAGNOSIS — R931 Abnormal findings on diagnostic imaging of heart and coronary circulation: Secondary | ICD-10-CM | POA: Diagnosis not present

## 2019-07-16 DIAGNOSIS — E785 Hyperlipidemia, unspecified: Secondary | ICD-10-CM | POA: Diagnosis not present

## 2019-07-16 DIAGNOSIS — Z7189 Other specified counseling: Secondary | ICD-10-CM | POA: Diagnosis not present

## 2019-07-16 DIAGNOSIS — R072 Precordial pain: Secondary | ICD-10-CM | POA: Diagnosis not present

## 2019-07-16 DIAGNOSIS — R079 Chest pain, unspecified: Secondary | ICD-10-CM

## 2019-07-16 DIAGNOSIS — I10 Essential (primary) hypertension: Secondary | ICD-10-CM | POA: Diagnosis not present

## 2019-07-16 LAB — BASIC METABOLIC PANEL
BUN/Creatinine Ratio: 14 (ref 12–28)
BUN: 10 mg/dL (ref 8–27)
CO2: 24 mmol/L (ref 20–29)
Calcium: 9.4 mg/dL (ref 8.7–10.3)
Chloride: 101 mmol/L (ref 96–106)
Creatinine, Ser: 0.7 mg/dL (ref 0.57–1.00)
GFR calc Af Amer: 103 mL/min/{1.73_m2} (ref >59)
GFR calc non Af Amer: 89 mL/min/{1.73_m2} (ref >59)
Glucose: 92 mg/dL (ref 65–99)
Potassium: 4.5 mmol/L (ref 3.5–5.2)
Sodium: 140 mmol/L (ref 134–144)

## 2019-07-16 MED ORDER — METOPROLOL TARTRATE 50 MG PO TABS: ORAL_TABLET | ORAL | 0 refills | Status: DC

## 2019-07-16 NOTE — Patient Instructions (Signed)
Medication Instructions:  Your Physician recommend you continue on your current medication as directed.    *If you need a refill on your cardiac medications before your next appointment, please call your pharmacy*  Lab Work: Your physician recommends that you return for lab work today (BMP).  If you have labs (blood work) drawn today and your tests are completely normal, you will receive your results only by: Marland Kitchen MyChart Message (if you have MyChart) OR . A paper copy in the mail If you have any lab test that is abnormal or we need to change your treatment, we will call you to review the results.  Testing/Procedures: Cardiac CT Angiography (CTA), is a special type of CT scan that uses a computer to produce multi-dimensional views of major blood vessels throughout the body. In CT angiography, a contrast material is injected through an IV to help visualize the blood vessels [facility]    Follow-Up: At [facility], you and your health needs are our priority.  As part of our continuing mission to provide you with exceptional heart care, we have created designated Provider Care Teams.  These Care Teams include your primary Cardiologist (physician) and Advanced Practice Providers (APPs -  Physician Assistants and Nurse Practitioners) who all work together to provide you with the care you need, when you need it.  Your next appointment:   6 week(s)  The format for your next appointment:   In Person  Provider:   Buford Dresser, MD  Your cardiac CT will be scheduled at one of the below locations:   [facility] [address] [place], [place] [address] [phone]  If scheduled at [facility], please arrive at the [facility] main entrance of [facility] 30 minutes prior to test start time. Proceed to the [facility] Radiology Department (first floor) to check-in and test prep.  Please follow these instructions carefully (unless otherwise  directed):  On the Night Before the Test: . Be sure to Drink plenty of water. . Do not consume any caffeinated/decaffeinated beverages or chocolate 12 hours prior to your test. . Do not take any antihistamines 12 hours prior to your test. . If you take Metformin do not take 24 hours prior to test.  On the Day of the Test: . Drink plenty of water. Do not drink any water within one hour of the test. . Do not eat any food 4 hours prior to the test. . You may take your regular medications prior to the test.  . Take metoprolol (Lopressor) two hours prior to test. . HOLD Furosemide/Hydrochlorothiazide morning of the test. . FEMALES- please wear underwire-free bra if available       After the Test: . Drink plenty of water. . After receiving IV contrast, you may experience a mild flushed feeling. This is normal. . On occasion, you may experience a mild rash up to 24 hours after the test. This is not dangerous. If this occurs, you can take Benadryl 25 mg and increase your fluid intake. . If you experience trouble breathing, this can be serious. If it is severe call 911 IMMEDIATELY. If it is mild, please call our office. . If you take any of these medications: Glipizide/Metformin, Avandament, Glucavance, please do not take 48 hours after completing test unless otherwise instructed.   Once we have confirmed authorization from your insurance company, we will call you to set up a date and time for your test.   For non-scheduling related  questions, please contact the cardiac imaging nurse navigator should you have any questions/concerns: Marchia Bond, RN Navigator Cardiac Imaging [facility] [phone] mobile

## 2019-07-16 NOTE — Progress Notes (Signed)
Cardiology Office Note:    Date:  07/16/2019   ID:  Anne, Gibbs 12/31/1950, MRN [id number]  PCP:  Antony Contras, MD  Cardiologist:  Buford Dresser, MD  Referring MD: Antony Contras, MD   CC: new patient evaluation for exertional dyspnea and pain with exertion  History of Present Illness:    Anne Gibbs is a 69 y.o. female with a hx of dyslipidemia, OSA not on CPAP who is seen as a new consult at the request of Antony Contras, MD for the evaluation and management of exertional dyspnea and exertional pain.  Note received and reviewed from Dr. Laqueta Linden most recent visit with the patient, dated 07/11/2019. Per the note, she had a 6 month history of bilateral scapular tightness and shortness of breath when walking up hill. Improves with walking more slowly uphill. No symptoms at rest.   Today: Has walked for many years, but in the last year has noticed pain between her shoulder blades and shortness of breath when walking up long uphills. Improved with slowing pace. Does note shortness of breath climbing stairs at home as well. Has also noted left arm heaviness (not right arm) when being active.  Has been told that her blood pressure has been gradually creeping higher. Has been monitoring with Dr. Moreen Fowler.  No history of prior cardiac evaluation that she knows of. Lost 30 lbs with weight watchers, though has gained 15 back with the pandemic.   FH: both mother and father died of MI, father was 15, mother was 56. Mother also had a history of blood clots. Sister has had recent surgery and complications, which has been stressful. Two brothers and a sister have had MI as well.   No central/front chest pain. No shortness of breath at rest. No syncope or palpitations. No PND or orthopnea. No LE edema.  Past Medical History:  Diagnosis Date  . High cholesterol     Past Surgical History:  Procedure Laterality Date  . BREAST BIOPSY Left 2017   benign    Current  Medications: Current Outpatient Medications on File Prior to Visit  Medication Sig  . aspirin EC 81 MG tablet Take 81 mg by mouth daily.  Marland Kitchen atorvastatin (LIPITOR) 40 MG tablet   . ZOSTAVAX 38756 UNT/0.65ML injection    No current facility-administered medications on file prior to visit.     Allergies:   Patient has no known allergies.   Social History   Tobacco Use  . Smoking status: Never Smoker  . Smokeless tobacco: Never Used  Substance Use Topics  . Alcohol use: No  . Drug use: No    Family History: both mother and father died of MI, father was 105, mother was 24. Mother also had a history of blood clots. Sister has had recent surgery and complications, which has been stressful. Two brothers and a sister have had MI as well.   ROS:   Please see the history of present illness.  Additional pertinent ROS: Constitutional: Negative for chills, fever, night sweats, unintentional weight loss  HENT: Negative for ear pain and hearing loss.   Eyes: Negative for loss of vision and eye pain.  Respiratory: Negative for cough, sputum, wheezing.   Cardiovascular: See HPI. Gastrointestinal: Negative for abdominal pain, melena, and hematochezia.  Genitourinary: Negative for dysuria and hematuria.  Musculoskeletal: Negative for falls and myalgias.  Skin: Negative for itching and rash.  Neurological: Negative for focal weakness, focal sensory changes and loss of consciousness.  Endo/Heme/Allergies: Does not  bruise/bleed easily.     EKGs/Labs/Other Studies Reviewed:    The following studies were reviewed today: ABIs 2017 reviewed, no other studies available  EKG:  EKG is personally reviewed.  The ekg ordered today demonstrates NSR  Recent Labs: No results found for requested labs within last 8760 hours.  Recent Lipid Panel No results found for: CHOL, TRIG, HDL, CHOLHDL, VLDL, LDLCALC, LDLDIRECT  Physical Exam:    VS:  BP 139/77   Pulse 76   Ht 5\' 2"  (1.575 m)   Wt 155 lb 9.6  oz (70.6 kg)   SpO2 98%   BMI 28.46 kg/m     Wt Readings from Last 3 Encounters:  07/16/19 155 lb 9.6 oz (70.6 kg)    GEN: Well nourished, well developed in no acute distress HEENT: Normal, moist mucous membranes NECK: No JVD CARDIAC: regular rhythm, normal S1 and S2, no rubs or gallops. No murmurs. VASCULAR: Radial and DP pulses 2+ bilaterally. No carotid bruits RESPIRATORY:  Clear to auscultation without rales, wheezing or rhonchi  ABDOMEN: Soft, non-tender, non-distended MUSCULOSKELETAL:  Ambulates independently SKIN: Warm and dry, no edema NEUROLOGIC:  Alert and oriented x 3. No focal neuro deficits noted. PSYCHIATRIC:  Normal affect    ASSESSMENT:    1. Dyspnea on exertion   2. Pre-procedure lab exam   3. Chest pain, unspecified type   4. Precordial pain   5. Abnormal findings on diagnostic imaging of heart and coronary circulation    6. Cardiac risk counseling   7. Counseling on health promotion and disease prevention   8. Hyperlipidemia, unspecified hyperlipidemia type   9. Family history of heart disease    PLAN:    Exertional dyspnea and exertional back pain: concerning for anginal equivalent. Symptoms thus far are stable angina, no rest or accelerating symptoms at this time. -We spent significant time today reviewing different parts of the cardiovascular system (electrical, vascular, functional, and valvular). We discussed how each of these systems can present with different symptoms. We reviewed that there are different ways we evaluate these symptoms with tests. We reviewed which tests I think are most appropriate given the symptoms, and we discussed risks/benefits and limitations of each of these tests. Please see summary below. We also discussed that if testing is unrevealing for a cardiac cause of the symptoms, there are many noncardiac causes as well that can contribute to symptoms. If the heart is ruled out, then I recommend returning to PCP to discuss alternative  diagnoses. --discussed treadmill stress, nuclear stress/lexiscan, and CT coronary angiography. Discussed pros and cons of each, including but not limited to false positive/false negative risk, radiation risk, and risk of IV contrast dye. Based on shared decision making, decision was made to pursue CT coronary angiography. -will give one time dose of metoprolol 2 hours prior to scheduled test -counseled on need to get BMET prior to test -counseled on use of sublingual nitroglycerin and its importance to a good test  Cardiac risk counseling and prevention recommendations: has a family history of heart disease. -recommend heart healthy/Mediterranean diet, with whole grains, fruits, vegetable, fish, lean meats, nuts, and olive oil. Limit salt. -recommend moderate walking, 3-5 times/week for 30-50 minutes each session. Aim for at least 150 minutes.week. Goal should be pace of 3 miles/hours, or walking 1.5 miles in 30 minutes -recommend avoidance of tobacco products. Avoid excess alcohol.  Dyslipidemia, family history of heart disease -already on aspirin 81 mg and atorvastatin 40 mg daily, continue  Plan for follow up:  6 weeks or sooner PRN  Buford Dresser, MD, PhD [place]  [facility]    Medication Adjustments/Labs and Tests Ordered: Current medicines are reviewed at length with the patient today.  Concerns regarding medicines are outlined above.  Orders Placed This Encounter  Procedures  . CT CORONARY MORPH W/CTA COR W/SCORE W/CA W/CM &/OR WO/CM  . CT CORONARY FRACTIONAL FLOW RESERVE DATA PREP  . CT CORONARY FRACTIONAL FLOW RESERVE FLUID ANALYSIS  . Basic metabolic panel  . EKG 12-Lead   Meds ordered this encounter  Medications  . metoprolol tartrate (LOPRESSOR) 50 MG tablet    Sig: TAKE 1 TABLET 2 HR PRIOR TO CARDIAC PROCEDURE    Dispense:  1 tablet    Refill:  0    Patient Instructions  Medication Instructions:  Your Physician recommend you continue on your current  medication as directed.    *If you need a refill on your cardiac medications before your next appointment, please call your pharmacy*  Lab Work: Your physician recommends that you return for lab work today (BMP).  If you have labs (blood work) drawn today and your tests are completely normal, you will receive your results only by: Marland Kitchen MyChart Message (if you have MyChart) OR . A paper copy in the mail If you have any lab test that is abnormal or we need to change your treatment, we will call you to review the results.  Testing/Procedures: Cardiac CT Angiography (CTA), is a special type of CT scan that uses a computer to produce multi-dimensional views of major blood vessels throughout the body. In CT angiography, a contrast material is injected through an IV to help visualize the blood vessels [facility]    Follow-Up: At [facility], you and your health needs are our priority.  As part of our continuing mission to provide you with exceptional heart care, we have created designated Provider Care Teams.  These Care Teams include your primary Cardiologist (physician) and Advanced Practice Providers (APPs -  Physician Assistants and Nurse Practitioners) who all work together to provide you with the care you need, when you need it.  Your next appointment:   6 week(s)  The format for your next appointment:   In Person  Provider:   Buford Dresser, MD  Your cardiac CT will be scheduled at one of the below locations:   [facility] [address] [place], [place] [address] [phone]  If scheduled at [facility], please arrive at the [facility] main entrance of [facility] 30 minutes prior to test start time. Proceed to the [facility] Radiology Department (first floor) to check-in and test prep.  Please follow these instructions carefully (unless otherwise directed):  On the Night Before the Test: . Be sure to Drink plenty of  water. . Do not consume any caffeinated/decaffeinated beverages or chocolate 12 hours prior to your test. . Do not take any antihistamines 12 hours prior to your test. . If you take Metformin do not take 24 hours prior to test.  On the Day of the Test: . Drink plenty of water. Do not drink any water within one hour of the test. . Do not eat any food 4 hours prior to the test. . You may take your regular medications prior to the test.  . Take metoprolol (Lopressor) two hours prior to test. . HOLD Furosemide/Hydrochlorothiazide morning of the test. . FEMALES- please wear underwire-free bra if available  After the Test: . Drink plenty of water. . After receiving IV contrast, you may experience a mild flushed feeling. This is normal. . On occasion, you may experience a mild rash up to 24 hours after the test. This is not dangerous. If this occurs, you can take Benadryl 25 mg and increase your fluid intake. . If you experience trouble breathing, this can be serious. If it is severe call 911 IMMEDIATELY. If it is mild, please call our office. . If you take any of these medications: Glipizide/Metformin, Avandament, Glucavance, please do not take 48 hours after completing test unless otherwise instructed.   Once we have confirmed authorization from your insurance company, we will call you to set up a date and time for your test.   For non-scheduling related questions, please contact the cardiac imaging nurse navigator should you have any questions/concerns: Marchia Bond, RN Navigator Cardiac Imaging [facility] [phone] mobile      Signed, Buford Dresser, MD PhD 07/16/2019    [place]

## 2019-07-18 ENCOUNTER — Other Ambulatory Visit: Payer: Self-pay

## 2019-07-19 DIAGNOSIS — M5136 Other intervertebral disc degeneration, lumbar region: Secondary | ICD-10-CM | POA: Diagnosis not present

## 2019-07-19 DIAGNOSIS — M545 Low back pain: Secondary | ICD-10-CM | POA: Diagnosis not present

## 2019-07-30 DIAGNOSIS — M545 Low back pain: Secondary | ICD-10-CM | POA: Diagnosis not present

## 2019-08-16 DIAGNOSIS — Z20828 Contact with and (suspected) exposure to other viral communicable diseases: Secondary | ICD-10-CM | POA: Diagnosis not present

## 2019-08-22 ENCOUNTER — Ambulatory Visit (HOSPITAL_COMMUNITY): Payer: Medicare Other

## 2019-08-28 DIAGNOSIS — Z20822 Contact with and (suspected) exposure to covid-19: Secondary | ICD-10-CM | POA: Diagnosis not present

## 2019-09-02 ENCOUNTER — Ambulatory Visit: Payer: Medicare Other | Admitting: Cardiology

## 2019-09-16 ENCOUNTER — Encounter: Payer: Self-pay | Admitting: Cardiology

## 2019-09-17 ENCOUNTER — Other Ambulatory Visit: Payer: Self-pay

## 2019-09-17 DIAGNOSIS — Z01812 Encounter for preprocedural laboratory examination: Secondary | ICD-10-CM

## 2019-09-17 LAB — BASIC METABOLIC PANEL
BUN/Creatinine Ratio: 19 (ref 12–28)
BUN: 11 mg/dL (ref 8–27)
CO2: 23 mmol/L (ref 20–29)
Calcium: 9.4 mg/dL (ref 8.7–10.3)
Chloride: 104 mmol/L (ref 96–106)
Creatinine, Ser: 0.59 mg/dL (ref 0.57–1.00)
GFR calc Af Amer: 109 mL/min/{1.73_m2} (ref >59)
GFR calc non Af Amer: 94 mL/min/{1.73_m2} (ref >59)
Glucose: 101 mg/dL — ABNORMAL HIGH (ref 65–99)
Potassium: 4.6 mmol/L (ref 3.5–5.2)
Sodium: 141 mmol/L (ref 134–144)

## 2019-09-20 ENCOUNTER — Telehealth: Payer: Self-pay | Admitting: Cardiology

## 2019-09-20 NOTE — Telephone Encounter (Signed)
Patient states that she received a letter in the mail that she is being charged for her no show appointment and is requesting that it be waived.

## 2019-09-20 NOTE — Telephone Encounter (Signed)
Patient states she spoke with someone in billing about her no show fee for her appointment 09/02/2019. She states they told her they have not received a billing notice for the $50 no show fee. She states they told her to ask if it can be waived. She says her appointment was for a 6 week f/u from her procedure. She says she canceled the procedure, because her family had tested positive for covid. She says she forgot to cancel the appointment and is very apologetic.

## 2019-10-04 ENCOUNTER — Other Ambulatory Visit (INDEPENDENT_AMBULATORY_CARE_PROVIDER_SITE_OTHER): Payer: Medicare Other

## 2019-10-04 DIAGNOSIS — I10 Essential (primary) hypertension: Secondary | ICD-10-CM

## 2019-10-07 ENCOUNTER — Telehealth (HOSPITAL_COMMUNITY): Payer: Self-pay | Admitting: Emergency Medicine

## 2019-10-07 NOTE — Telephone Encounter (Signed)
Reaching out to patient to offer assistance regarding upcoming cardiac imaging study; pt verbalizes understanding of appt date/time, parking situation and where to check in, pre-test NPO status and medications ordered, and verified current allergies; name and call back number provided for further questions should they arise Sumayah Bearse RN Navigator Cardiac Imaging [facility] [phone] office [phone] cell 

## 2019-10-08 ENCOUNTER — Ambulatory Visit (HOSPITAL_COMMUNITY)
Admission: RE | Admit: 2019-10-08 | Discharge: 2019-10-08 | Disposition: A | Discharge status: Home / Self Care | Payer: Medicare Other | Source: Ambulatory Visit | Attending: Cardiology | Admitting: Cardiology

## 2019-10-08 ENCOUNTER — Other Ambulatory Visit: Payer: Self-pay

## 2019-10-08 DIAGNOSIS — I2510 Atherosclerotic heart disease of native coronary artery without angina pectoris: Secondary | ICD-10-CM | POA: Diagnosis not present

## 2019-10-08 DIAGNOSIS — R079 Chest pain, unspecified: Secondary | ICD-10-CM | POA: Diagnosis not present

## 2019-10-08 DIAGNOSIS — R072 Precordial pain: Secondary | ICD-10-CM | POA: Diagnosis not present

## 2019-10-08 MED ORDER — IOHEXOL 350 MG/ML SOLN
80.0000 mL | Freq: Once | INTRAVENOUS | Status: AC | PRN
  Administered 2019-10-08: 09:00:00 80 mL via INTRAVENOUS

## 2019-10-08 MED ORDER — NITROGLYCERIN 0.4 MG SL SUBL
0.8000 mg | SUBLINGUAL_TABLET | Freq: Once | SUBLINGUAL | Status: AC
  Administered 2019-10-08: 0.8 mg via SUBLINGUAL

## 2019-10-08 MED ORDER — NITROGLYCERIN 0.4 MG SL SUBL
SUBLINGUAL_TABLET | SUBLINGUAL | Status: AC
  Filled 2019-10-08: qty 2

## 2019-10-09 ENCOUNTER — Ambulatory Visit (HOSPITAL_COMMUNITY)
Admission: RE | Admit: 2019-10-09 | Discharge: 2019-10-09 | Disposition: A | Discharge status: Home / Self Care | Payer: Medicare Other | Source: Ambulatory Visit | Attending: Cardiology | Admitting: Cardiology

## 2019-10-09 DIAGNOSIS — R079 Chest pain, unspecified: Secondary | ICD-10-CM | POA: Insufficient documentation

## 2019-10-09 DIAGNOSIS — R931 Abnormal findings on diagnostic imaging of heart and coronary circulation: Secondary | ICD-10-CM | POA: Diagnosis not present

## 2019-10-09 DIAGNOSIS — R072 Precordial pain: Secondary | ICD-10-CM | POA: Insufficient documentation

## 2019-10-10 DIAGNOSIS — Z6828 Body mass index (BMI) 28.0-28.9, adult: Secondary | ICD-10-CM | POA: Diagnosis not present

## 2019-10-10 DIAGNOSIS — M545 Low back pain: Secondary | ICD-10-CM | POA: Diagnosis not present

## 2019-10-10 DIAGNOSIS — R072 Precordial pain: Secondary | ICD-10-CM | POA: Diagnosis not present

## 2019-10-10 DIAGNOSIS — R931 Abnormal findings on diagnostic imaging of heart and coronary circulation: Secondary | ICD-10-CM | POA: Diagnosis not present

## 2019-10-10 DIAGNOSIS — R079 Chest pain, unspecified: Secondary | ICD-10-CM | POA: Diagnosis not present

## 2019-10-16 ENCOUNTER — Ambulatory Visit (INDEPENDENT_AMBULATORY_CARE_PROVIDER_SITE_OTHER): Payer: Medicare Other | Admitting: Cardiology

## 2019-10-16 ENCOUNTER — Encounter: Payer: Self-pay | Admitting: Cardiology

## 2019-10-16 ENCOUNTER — Other Ambulatory Visit: Payer: Self-pay

## 2019-10-16 VITALS — BP 126/80 | HR 78 | Ht 62.0 in | Wt 159.0 lb

## 2019-10-16 DIAGNOSIS — I208 Other forms of angina pectoris: Secondary | ICD-10-CM

## 2019-10-16 DIAGNOSIS — I2584 Coronary atherosclerosis due to calcified coronary lesion: Secondary | ICD-10-CM

## 2019-10-16 DIAGNOSIS — Z8249 Family history of ischemic heart disease and other diseases of the circulatory system: Secondary | ICD-10-CM

## 2019-10-16 DIAGNOSIS — E782 Mixed hyperlipidemia: Secondary | ICD-10-CM

## 2019-10-16 DIAGNOSIS — Z7189 Other specified counseling: Secondary | ICD-10-CM | POA: Diagnosis not present

## 2019-10-16 DIAGNOSIS — I2510 Atherosclerotic heart disease of native coronary artery without angina pectoris: Secondary | ICD-10-CM | POA: Diagnosis not present

## 2019-10-16 MED ORDER — NITROGLYCERIN 0.4 MG SL SUBL: 0.4000 mg | SUBLINGUAL_TABLET | SUBLINGUAL | 3 refills | Status: DC | PRN

## 2019-10-16 MED ORDER — METOPROLOL SUCCINATE ER 25 MG PO TB24: 25.0000 mg | ORAL_TABLET | Freq: Every day | ORAL | 3 refills | Status: DC

## 2019-10-16 MED ORDER — ATORVASTATIN CALCIUM 80 MG PO TABS: 80.0000 mg | ORAL_TABLET | Freq: Every day | ORAL | 3 refills | Status: DC

## 2019-10-16 NOTE — Progress Notes (Signed)
Cardiology Office Note:    Date:  10/16/2019   ID:  Anne Gibbs, DOB 15-Jun-1950, MRN [id number]  PCP:  Antony Contras, MD  Cardiologist:  Buford Dresser, MD  Referring MD: Antony Contras, MD   CC: follow up  History of Present Illness:    Anne Gibbs is a 69 y.o. female with a hx of dyslipidemia, OSA not on CPAP who is seen for follow up. I initially saw her 07/16/19 as a new consult at the request of Antony Contras, MD for the evaluation and management of exertional dyspnea and exertional pain.  Cardiac history: bilateral scapular tightness and shortness of breath when walking up hill.  FH: both mother and father died of MI, father was 76, mother was 61. Mother also had a history of blood clots. Sister has had recent surgery and complications, which has been stressful. Two brothers and a sister have had MI as well.   Today: Reviewed CT results together today. Continues to have exertional dyspnea, but walking less due to back pain. About to start therapy for her low back, will see if that lets her be more active. No nonexertional symptoms.  We discussed the results of her CT, the difference between stable and unstable angina, how we trial medical management, options for medical management, options if medical management doesn't work. She was understandably concerned, but her husband has stents, so she has general knowledge about CAD and management. All questions answered. Plan as below.  Past Medical History:  Diagnosis Date  . High cholesterol     Past Surgical History:  Procedure Laterality Date  . BREAST BIOPSY Left 2017   benign    Current Medications: Current Outpatient Medications on File Prior to Visit  Medication Sig  . aspirin EC 81 MG tablet Take 81 mg by mouth daily.  Marland Kitchen ZOSTAVAX 16109 UNT/0.65ML injection    No current facility-administered medications on file prior to visit.     Allergies:   Patient has no known allergies.   Social History    Tobacco Use  . Smoking status: Never Smoker  . Smokeless tobacco: Never Used  Substance Use Topics  . Alcohol use: No  . Drug use: No    Family History: both mother and father died of MI, father was 53, mother was 17. Mother also had a history of blood clots. Sister has had recent surgery and complications, which has been stressful. Two brothers and a sister have had MI as well.   ROS:   Please see the history of present illness.  Additional pertinent ROS otherwise unremarkable.   EKGs/Labs/Other Studies Reviewed:    The following studies were reviewed today: CTA cardiac 10/09/19 FINDINGS: Coronary calcium score: The patient's coronary artery calcium score is 966, which places the patient in the 97th percentile.  Coronary arteries: Normal coronary origins.  Right dominance.  Right Coronary Artery: Normal caliber vessel, gives rise to PDA. Focal calcified plaque in the proximal portion with 25-49% stenosis.  Left Main Coronary Artery: Normal caliber vessel. Calcified plaque at ostial LM and distal LM. Ostial plaque with trivial stenosis, but distal LM with 25-40% stenosis.  Left Anterior Descending Coronary Artery: Normal caliber vessel. Diffuse predominantly calcified plaque throughout proximal and mid portion. Most severe stenosis appears to be 50-69%. Gives rise to medium D1 and small D2 diagonal branches. D1 lumen not visible beyond most proximal portion.  Left Circumflex Artery: Normal caliber vessel. Focal proximal and mid portion calcified plaque. Most significant stenosis appears to be  25-40% at the takeoff from the left main, but there is a proximal portion of the LCX not well visualized except in one R-R interval (65%). Cannot exclude stenosis in this location. Gives rise to 1 OM branch.  Aorta: Normal size, 29 mm at the mid ascending aorta (level of the PA bifurcation) measured double oblique. Scattered calcifications. No dissection.  Aortic Valve: No  calcifications. Trileaflet.  Other findings: Normal pulmonary vein drainage into the left atrium. Normal left atrial appendage without a thrombus. Normal size of the pulmonary artery.  IMPRESSION: 1. Moderate CAD with 3 vessel disease, CADRADS = 3. CT FFR will be performed and reported separately. 2. Coronary calcium score of 996. This was 97th percentile for age and sex matched control. 3. Normal coronary origin with right dominance.  FFR 1. Left Main:  No significant stenosis. FFR = 0.95  2. LAD: D1 small caliber and unable to be modeled. Proximal FFR = 0.82 after focal stenosis, Mid FFR = 0.75, Distal FFR = 0.69 3. LCX: Proximal FFR = 0.87, Distal FFR = 0.65 with discrete stenosis near mid portion of vessel. 4. RCA: No significant stenosis. Proximal FFR = 0.98, Mid FFR = 0.93. Distal caliber too small to be modeled.  IMPRESSION: 1. CT FFR analysis shows focal stenosis in LAD/LCX with initial FFR above 0.8 threshold, but as vessel continues, FFR becomes positive for significant flow limitation.   EKG:  EKG is personally reviewed.  The ekg ordered 07/16/19 demonstrates NSR  Recent Labs: 09/17/2019: BUN 11; Creatinine, Ser 0.59; Potassium 4.6; Sodium 141  Recent Lipid Panel No results found for: CHOL, TRIG, HDL, CHOLHDL, VLDL, LDLCALC, LDLDIRECT  Physical Exam:    VS:  BP 126/80   Pulse 78   Ht 5\' 2"  (1.575 m)   Wt 159 lb (72.1 kg)   SpO2 98%   BMI 29.08 kg/m     Wt Readings from Last 3 Encounters:  10/16/19 159 lb (72.1 kg)  07/16/19 155 lb 9.6 oz (70.6 kg)    GEN: Well nourished, well developed in no acute distress HEENT: Normal, moist mucous membranes NECK: No JVD CARDIAC: regular rhythm, normal S1 and S2, no rubs or gallops. No murmur. VASCULAR: Radial and DP pulses 2+ bilaterally. No carotid bruits RESPIRATORY:  Clear to auscultation without rales, wheezing or rhonchi  ABDOMEN: Soft, non-tender, non-distended MUSCULOSKELETAL:  Ambulates  independently SKIN: Warm and dry, no edema NEUROLOGIC:  Alert and oriented x 3. No focal neuro deficits noted. PSYCHIATRIC:  Normal affect   ASSESSMENT:    1. Coronary artery disease due to calcified coronary lesion   2. Stable angina ([place])   3. Family history of heart disease   4. Mixed hyperlipidemia   5. Cardiac risk counseling   6. Counseling on health promotion and disease prevention    PLAN:    CAD, at least moderate, with heavy calcification in LAD and LCX especially and stenosis as noted by FFR -Exertional dyspnea and exertional back pain are her symptoms -discussed stable and unstable angina, when to call 911. She is currently having stable angina -intensifying atorvastatin to 80 mg today -continue aspirin 81 mg -will start low dose metoprolol succinate at 25 mg. Not a lot of blood pressure room -Discussed nitrates, calcium channel blockers, and ranolazine as adjuncts -discussed that if we cannot manage her symptoms with medications, we may pursue cath. Given the extent and severity of her calcification, I suspect that bypass may be a better option than stenting, but this will be determined  formally at cath. -given SL NG PRN and instructed on use -counseled on red flag warning signs that need 911 and immediate medical attention  Mixed hyperlipidemia: Per [facility], lipids 07/11/19: Tchol 168, HDL 40, LDL 64, TG 208 -increasing atorvastatin to be aggressive re: lowest possible LDL  Cardiac risk counseling and prevention recommendations: has a family history of heart disease. -recommend heart healthy/Mediterranean diet, with whole grains, fruits, vegetable, fish, lean meats, nuts, and olive oil. Limit salt. -recommend moderate walking, 3-5 times/week for 30-50 minutes each session. Aim for at least 150 minutes.week. Goal should be pace of 3 miles/hours, or walking 1.5 miles in 30 minutes -recommend avoidance of tobacco products. Avoid excess alcohol.  Plan for follow up: 1 monthor  sooner PRN  Buford Dresser, MD, PhD [place]  [facility]    Medication Adjustments/Labs and Tests Ordered: Current medicines are reviewed at length with the patient today.  Concerns regarding medicines are outlined above.  No orders of the defined types were placed in this encounter.  Meds ordered this encounter  Medications  . atorvastatin (LIPITOR) 80 MG tablet    Sig: Take 1 tablet (80 mg total) by mouth daily.    Dispense:  90 tablet    Refill:  3  . metoprolol succinate (TOPROL XL) 25 MG 24 hr tablet    Sig: Take 1 tablet (25 mg total) by mouth daily.    Dispense:  90 tablet    Refill:  3  . nitroGLYCERIN (NITROSTAT) 0.4 MG SL tablet    Sig: Place 1 tablet (0.4 mg total) under the tongue every 5 (five) minutes as needed for chest pain.    Dispense:  90 tablet    Refill:  3    Patient Instructions  Medication Instructions:  Increase Atorvastatin to 80 mg daily Start Metoprolol Succinate 25 mg daily Take Nitroglycerin 0.4 mg -1 tablet (0.4 mg total) under the tongue every 5 (five) minutes as needed for chest pain.   *If you need a refill on your cardiac medications before your next appointment, please call your pharmacy*   Lab Work: None   Testing/Procedures: None   Follow-Up: At [facility], you and your health needs are our priority.  As part of our continuing mission to provide you with exceptional heart care, we have created designated Provider Care Teams.  These Care Teams include your primary Cardiologist (physician) and Advanced Practice Providers (APPs -  Physician Assistants and Nurse Practitioners) who all work together to provide you with the care you need, when you need it.  We recommend signing up for the patient portal called "MyChart".  Sign up information is provided on this After Visit Summary.  MyChart is used to connect with patients for Virtual Visits (Telemedicine).  Patients are able to view lab/test results, encounter notes,  upcoming appointments, etc.  Non-urgent messages can be sent to your provider as well.   To learn more about what you can do with MyChart, go to [email or web address].    Your next appointment:   1 month(s)  The format for your next appointment:   In Person  Provider:   Buford Dresser, MD   Nitroglycerin sublingual tablets What is this medicine? NITROGLYCERIN (nye troe GLI ser in) is a type of vasodilator. It relaxes blood vessels, increasing the blood and oxygen supply to your heart. This medicine is used to relieve chest pain caused by angina. It is also used to prevent chest pain before activities like climbing stairs, going outdoors in cold  weather, or sexual activity. This medicine may be used for other purposes; ask your health care provider or pharmacist if you have questions. COMMON BRAND NAME(S): Nitroquick, Nitrostat, Nitrotab What should I tell my health care provider before I take this medicine? They need to know if you have any of these conditions:  anemia  head injury, recent stroke, or bleeding in the brain  liver disease  previous heart attack  an unusual or allergic reaction to nitroglycerin, other medicines, foods, dyes, or preservatives  pregnant or trying to get pregnant  breast-feeding How should I use this medicine? Take this medicine by mouth as needed. At the first sign of an angina attack (chest pain or tightness) place one tablet under your tongue. You can also take this medicine 5 to 10 minutes before an event likely to produce chest pain. Follow the directions on the prescription label. Let the tablet dissolve under the tongue. Do not swallow whole. Replace the dose if you accidentally swallow it. It will help if your mouth is not dry. Saliva around the tablet will help it to dissolve more quickly. Do not eat or drink, smoke or chew tobacco while a tablet is dissolving. If you are not better within 5 minutes after taking ONE dose of  nitroglycerin, call 9-1-1 immediately to seek emergency medical care. Do not take more than 3 nitroglycerin tablets over 15 minutes. If you take this medicine often to relieve symptoms of angina, your doctor or health care professional may provide you with different instructions to manage your symptoms. If symptoms do not go away after following these instructions, it is important to call 9-1-1 immediately. Do not take more than 3 nitroglycerin tablets over 15 minutes. Talk to your pediatrician regarding the use of this medicine in children. Special care may be needed. Overdosage: If you think you have taken too much of this medicine contact a poison control center or emergency room at once. NOTE: This medicine is only for you. Do not share this medicine with others. What if I miss a dose? This does not apply. This medicine is only used as needed. What may interact with this medicine? Do not take this medicine with any of the following medications:  certain migraine medicines like ergotamine and dihydroergotamine (DHE)  medicines used to treat erectile dysfunction like sildenafil, tadalafil, and vardenafil  riociguat This medicine may also interact with the following medications:  alteplase  aspirin  heparin  medicines for high blood pressure  medicines for mental depression  other medicines used to treat angina  phenothiazines like chlorpromazine, mesoridazine, prochlorperazine, thioridazine This list may not describe all possible interactions. Give your health care provider a list of all the medicines, herbs, non-prescription drugs, or dietary supplements you use. Also tell them if you smoke, drink alcohol, or use illegal drugs. Some items may interact with your medicine. What should I watch for while using this medicine? Tell your doctor or health care professional if you feel your medicine is no longer working. Keep this medicine with you at all times. Sit or lie down when you  take your medicine to prevent falling if you feel dizzy or faint after using it. Try to remain calm. This will help you to feel better faster. If you feel dizzy, take several deep breaths and lie down with your feet propped up, or bend forward with your head resting between your knees. You may get drowsy or dizzy. Do not drive, use machinery, or do anything that needs mental alertness  until you know how this drug affects you. Do not stand or sit up quickly, especially if you are an older patient. This reduces the risk of dizzy or fainting spells. Alcohol can make you more drowsy and dizzy. Avoid alcoholic drinks. Do not treat yourself for coughs, colds, or pain while you are taking this medicine without asking your doctor or health care professional for advice. Some ingredients may increase your blood pressure. What side effects may I notice from receiving this medicine? Side effects that you should report to your doctor or health care professional as soon as possible:  blurred vision  dry mouth  skin rash  sweating  the feeling of extreme pressure in the head  unusually weak or tired Side effects that usually do not require medical attention (report to your doctor or health care professional if they continue or are bothersome):  flushing of the face or neck  headache  irregular heartbeat, palpitations  nausea, vomiting This list may not describe all possible side effects. Call your doctor for medical advice about side effects. You may report side effects to FDA at 1-800-FDA-1088. Where should I keep my medicine? Keep out of the reach of children. Store at room temperature between 20 and 25 degrees C (68 and 77 degrees F). Store in [employer]. Protect from light and moisture. Keep tightly closed. Throw away any unused medicine after the expiration date. NOTE: This sheet is a summary. It may not cover all possible information. If you have questions about this medicine, talk to  your doctor, pharmacist, or health care provider.  2020 Elsevier/Gold Standard (2013-03-21 17:57:36)     Signed, Buford Dresser, MD PhD 10/16/2019    [facility]

## 2019-10-16 NOTE — Patient Instructions (Signed)
Medication Instructions:  Increase Atorvastatin to 80 mg daily Start Metoprolol Succinate 25 mg daily Take Nitroglycerin 0.4 mg -1 tablet (0.4 mg total) under the tongue every 5 (five) minutes as needed for chest pain.   *If you need a refill on your cardiac medications before your next appointment, please call your pharmacy*   Lab Work: None   Testing/Procedures: None   Follow-Up: At [facility], you and your health needs are our priority.  As part of our continuing mission to provide you with exceptional heart care, we have created designated Provider Care Teams.  These Care Teams include your primary Cardiologist (physician) and Advanced Practice Providers (APPs -  Physician Assistants and Nurse Practitioners) who all work together to provide you with the care you need, when you need it.  We recommend signing up for the patient portal called "MyChart".  Sign up information is provided on this After Visit Summary.  MyChart is used to connect with patients for Virtual Visits (Telemedicine).  Patients are able to view lab/test results, encounter notes, upcoming appointments, etc.  Non-urgent messages can be sent to your provider as well.   To learn more about what you can do with MyChart, go to [email or web address].    Your next appointment:   1 month(s)  The format for your next appointment:   In Person  Provider:   Buford Dresser, MD   Nitroglycerin sublingual tablets What is this medicine? NITROGLYCERIN (nye troe GLI ser in) is a type of vasodilator. It relaxes blood vessels, increasing the blood and oxygen supply to your heart. This medicine is used to relieve chest pain caused by angina. It is also used to prevent chest pain before activities like climbing stairs, going outdoors in cold weather, or sexual activity. This medicine may be used for other purposes; ask your health care provider or pharmacist if you have questions. COMMON BRAND NAME(S): Nitroquick,  Nitrostat, Nitrotab What should I tell my health care provider before I take this medicine? They need to know if you have any of these conditions:  anemia  head injury, recent stroke, or bleeding in the brain  liver disease  previous heart attack  an unusual or allergic reaction to nitroglycerin, other medicines, foods, dyes, or preservatives  pregnant or trying to get pregnant  breast-feeding How should I use this medicine? Take this medicine by mouth as needed. At the first sign of an angina attack (chest pain or tightness) place one tablet under your tongue. You can also take this medicine 5 to 10 minutes before an event likely to produce chest pain. Follow the directions on the prescription label. Let the tablet dissolve under the tongue. Do not swallow whole. Replace the dose if you accidentally swallow it. It will help if your mouth is not dry. Saliva around the tablet will help it to dissolve more quickly. Do not eat or drink, smoke or chew tobacco while a tablet is dissolving. If you are not better within 5 minutes after taking ONE dose of nitroglycerin, call 9-1-1 immediately to seek emergency medical care. Do not take more than 3 nitroglycerin tablets over 15 minutes. If you take this medicine often to relieve symptoms of angina, your doctor or health care professional may provide you with different instructions to manage your symptoms. If symptoms do not go away after following these instructions, it is important to call 9-1-1 immediately. Do not take more than 3 nitroglycerin tablets over 15 minutes. Talk to your pediatrician regarding the use of this  medicine in children. Special care may be needed. Overdosage: If you think you have taken too much of this medicine contact a poison control center or emergency room at once. NOTE: This medicine is only for you. Do not share this medicine with others. What if I miss a dose? This does not apply. This medicine is only used as  needed. What may interact with this medicine? Do not take this medicine with any of the following medications:  certain migraine medicines like ergotamine and dihydroergotamine (DHE)  medicines used to treat erectile dysfunction like sildenafil, tadalafil, and vardenafil  riociguat This medicine may also interact with the following medications:  alteplase  aspirin  heparin  medicines for high blood pressure  medicines for mental depression  other medicines used to treat angina  phenothiazines like chlorpromazine, mesoridazine, prochlorperazine, thioridazine This list may not describe all possible interactions. Give your health care provider a list of all the medicines, herbs, non-prescription drugs, or dietary supplements you use. Also tell them if you smoke, drink alcohol, or use illegal drugs. Some items may interact with your medicine. What should I watch for while using this medicine? Tell your doctor or health care professional if you feel your medicine is no longer working. Keep this medicine with you at all times. Sit or lie down when you take your medicine to prevent falling if you feel dizzy or faint after using it. Try to remain calm. This will help you to feel better faster. If you feel dizzy, take several deep breaths and lie down with your feet propped up, or bend forward with your head resting between your knees. You may get drowsy or dizzy. Do not drive, use machinery, or do anything that needs mental alertness until you know how this drug affects you. Do not stand or sit up quickly, especially if you are an older patient. This reduces the risk of dizzy or fainting spells. Alcohol can make you more drowsy and dizzy. Avoid alcoholic drinks. Do not treat yourself for coughs, colds, or pain while you are taking this medicine without asking your doctor or health care professional for advice. Some ingredients may increase your blood pressure. What side effects may I notice  from receiving this medicine? Side effects that you should report to your doctor or health care professional as soon as possible:  blurred vision  dry mouth  skin rash  sweating  the feeling of extreme pressure in the head  unusually weak or tired Side effects that usually do not require medical attention (report to your doctor or health care professional if they continue or are bothersome):  flushing of the face or neck  headache  irregular heartbeat, palpitations  nausea, vomiting This list may not describe all possible side effects. Call your doctor for medical advice about side effects. You may report side effects to FDA at 1-800-FDA-1088. Where should I keep my medicine? Keep out of the reach of children. Store at room temperature between 20 and 25 degrees C (68 and 77 degrees F). Store in [employer]. Protect from light and moisture. Keep tightly closed. Throw away any unused medicine after the expiration date. NOTE: This sheet is a summary. It may not cover all possible information. If you have questions about this medicine, talk to your doctor, pharmacist, or health care provider.  2020 Elsevier/Gold Standard (2013-03-21 17:57:36)

## 2019-10-17 ENCOUNTER — Other Ambulatory Visit: Payer: Self-pay | Admitting: Cardiology

## 2019-10-17 NOTE — Telephone Encounter (Signed)
  *  STAT* If patient is at the pharmacy, call can be transferred to refill team.   1. Which medications need to be refilled? (please list name of each medication and dose if known)   nitroGLYCERIN (NITROSTAT) 0.4 MG SL tablet    2. Which pharmacy/location (including street and city if local pharmacy) is medication to be sent to? [id number] - [place], [place] - [address] AT [address]  3. Do they need a 30 day or 90 day supply? 25 pills at a time  Pt said the 100 pills is too much and expensive.

## 2019-10-23 DIAGNOSIS — M545 Low back pain: Secondary | ICD-10-CM | POA: Diagnosis not present

## 2019-10-31 DIAGNOSIS — M545 Low back pain: Secondary | ICD-10-CM | POA: Diagnosis not present

## 2019-11-07 DIAGNOSIS — M545 Low back pain: Secondary | ICD-10-CM | POA: Diagnosis not present

## 2019-11-12 ENCOUNTER — Other Ambulatory Visit: Payer: Self-pay

## 2019-11-12 ENCOUNTER — Encounter: Payer: Self-pay | Admitting: Cardiology

## 2019-11-12 ENCOUNTER — Ambulatory Visit (INDEPENDENT_AMBULATORY_CARE_PROVIDER_SITE_OTHER): Payer: Medicare Other | Admitting: Cardiology

## 2019-11-12 VITALS — BP 138/62 | HR 85 | Ht 62.0 in | Wt 157.6 lb

## 2019-11-12 DIAGNOSIS — I2584 Coronary atherosclerosis due to calcified coronary lesion: Secondary | ICD-10-CM

## 2019-11-12 DIAGNOSIS — E782 Mixed hyperlipidemia: Secondary | ICD-10-CM

## 2019-11-12 DIAGNOSIS — I208 Other forms of angina pectoris: Secondary | ICD-10-CM | POA: Diagnosis not present

## 2019-11-12 DIAGNOSIS — I2510 Atherosclerotic heart disease of native coronary artery without angina pectoris: Secondary | ICD-10-CM | POA: Diagnosis not present

## 2019-11-12 DIAGNOSIS — Z8249 Family history of ischemic heart disease and other diseases of the circulatory system: Secondary | ICD-10-CM | POA: Diagnosis not present

## 2019-11-12 DIAGNOSIS — Z7189 Other specified counseling: Secondary | ICD-10-CM

## 2019-11-12 DIAGNOSIS — Z79899 Other long term (current) drug therapy: Secondary | ICD-10-CM | POA: Diagnosis not present

## 2019-11-12 NOTE — Progress Notes (Signed)
Cardiology Office Note:    Date:  11/12/2019   ID:  Anne Gibbs, DOB [birth year], MRN [id number]  PCP:  Antony Contras, MD  Cardiologist:  Buford Dresser, MD  Referring MD: Antony Contras, MD   CC: follow up  History of Present Illness:    Anne Gibbs is a 69 y.o. female with a hx of three vessel CAD by CT, dyslipidemia, OSA not on CPAP who is seen for follow up. I initially saw her 07/16/19 as a new consult at the request of Antony Contras, MD for the evaluation and management of exertional dyspnea and exertional pain.  Cardiac history: bilateral scapular tightness and shortness of breath when walking up hill. CT cardiac with moderate calcified 3 vessel CAD, FFR with focal significant stenosis in LCX and LAD.    Today: We attempted medical management of her CAD at last visit. Intensified atorvastatin, started metoprolol succinate. Tolerating medications well, though does need a nap in the afternoon now if she is busy in the morning. No more tightness in her shoulder blades, able to go back to walking hills without issues. Breathing is stable.   Reviewed CT results again today. Discussed red flag warning signs that need immediate medical attention.   Denies chest pain, shortness of breath at rest or with normal exertion. No PND, orthopnea, LE edema or unexpected weight gain. No syncope or palpitations.  Past Medical History:  Diagnosis Date  . High cholesterol     Past Surgical History:  Procedure Laterality Date  . BREAST BIOPSY Left 2017   benign    Current Medications: Current Outpatient Medications on File Prior to Visit  Medication Sig  . aspirin EC 81 MG tablet Take 81 mg by mouth daily.  Marland Kitchen atorvastatin (LIPITOR) 80 MG tablet Take 1 tablet (80 mg total) by mouth daily.  . metoprolol succinate (TOPROL XL) 25 MG 24 hr tablet Take 1 tablet (25 mg total) by mouth daily.  . nitroGLYCERIN (NITROSTAT) 0.4 MG SL tablet Place 1 tablet (0.4 mg total) under the tongue  every 5 (five) minutes as needed for chest pain.  Marland Kitchen ZOSTAVAX 29937 UNT/0.65ML injection    No current facility-administered medications on file prior to visit.     Allergies:   Patient has no known allergies.   Social History   Tobacco Use  . Smoking status: Never Smoker  . Smokeless tobacco: Never Used  Substance Use Topics  . Alcohol use: No  . Drug use: No    Family History: both mother and father died of MI, father was 70, mother was 82. Mother also had a history of blood clots. Sister has had recent surgery and complications, which has been stressful. Two brothers and a sister have had MI as well.   ROS:   Please see the history of present illness.  Additional pertinent ROS otherwise unremarkable.   EKGs/Labs/Other Studies Reviewed:    The following studies were reviewed today: CTA cardiac 10/09/19 FINDINGS: Coronary calcium score: The patient's coronary artery calcium score is 966, which places the patient in the 97th percentile.  Coronary arteries: Normal coronary origins.  Right dominance.  Right Coronary Artery: Normal caliber vessel, gives rise to PDA. Focal calcified plaque in the proximal portion with 25-49% stenosis.  Left Main Coronary Artery: Normal caliber vessel. Calcified plaque at ostial LM and distal LM. Ostial plaque with trivial stenosis, but distal LM with 25-40% stenosis.  Left Anterior Descending Coronary Artery: Normal caliber vessel. Diffuse predominantly calcified plaque throughout proximal and  mid portion. Most severe stenosis appears to be 50-69%. Gives rise to medium D1 and small D2 diagonal branches. D1 lumen not visible beyond most proximal portion.  Left Circumflex Artery: Normal caliber vessel. Focal proximal and mid portion calcified plaque. Most significant stenosis appears to be 25-40% at the takeoff from the left main, but there is a proximal portion of the LCX not well visualized except in one R-R interval (65%). Cannot  exclude stenosis in this location. Gives rise to 1 OM branch.  Aorta: Normal size, 29 mm at the mid ascending aorta (level of the PA bifurcation) measured double oblique. Scattered calcifications. No dissection.  Aortic Valve: No calcifications. Trileaflet.  Other findings: Normal pulmonary vein drainage into the left atrium. Normal left atrial appendage without a thrombus. Normal size of the pulmonary artery.  IMPRESSION: 1. Moderate CAD with 3 vessel disease, CADRADS = 3. CT FFR will be performed and reported separately. 2. Coronary calcium score of 996. This was 97th percentile for age and sex matched control. 3. Normal coronary origin with right dominance.  FFR 1. Left Main:  No significant stenosis. FFR = 0.95  2. LAD: D1 small caliber and unable to be modeled. Proximal FFR = 0.82 after focal stenosis, Mid FFR = 0.75, Distal FFR = 0.69 3. LCX: Proximal FFR = 0.87, Distal FFR = 0.65 with discrete stenosis near mid portion of vessel. 4. RCA: No significant stenosis. Proximal FFR = 0.98, Mid FFR = 0.93. Distal caliber too small to be modeled.  IMPRESSION: 1. CT FFR analysis shows focal stenosis in LAD/LCX with initial FFR above 0.8 threshold, but as vessel continues, FFR becomes positive for significant flow limitation.   EKG:  EKG is personally reviewed.  The ekg ordered 07/16/19 demonstrates NSR  Recent Labs: 09/17/2019: BUN 11; Creatinine, Ser 0.59; Potassium 4.6; Sodium 141  Recent Lipid Panel No results found for: CHOL, TRIG, HDL, CHOLHDL, VLDL, LDLCALC, LDLDIRECT  Physical Exam:    VS:  BP 138/62   Pulse 85   Ht 5\' 2"  (1.575 m)   Wt 157 lb 9.6 oz (71.5 kg)   SpO2 98%   BMI 28.83 kg/m     Wt Readings from Last 3 Encounters:  10/16/19 159 lb (72.1 kg)  07/16/19 155 lb 9.6 oz (70.6 kg)    GEN: Well nourished, well developed in no acute distress HEENT: Normal, moist mucous membranes NECK: No JVD CARDIAC: regular rhythm, normal S1 and S2, no  rubs or gallops. No murmur. VASCULAR: Radial and DP pulses 2+ bilaterally. No carotid bruits RESPIRATORY:  Clear to auscultation without rales, wheezing or rhonchi  ABDOMEN: Soft, non-tender, non-distended MUSCULOSKELETAL:  Ambulates independently SKIN: Warm and dry, no edema NEUROLOGIC:  Alert and oriented x 3. No focal neuro deficits noted. PSYCHIATRIC:  Normal affect   ASSESSMENT:    1. Coronary artery disease due to calcified coronary lesion   2. Medication management   3. Mixed hyperlipidemia   4. Family history of heart disease   5. Cardiac risk counseling   6. Counseling on health promotion and disease prevention    PLAN:    CAD, at least moderate, with heavy calcification in LAD and LCX especially and stenosis as noted by FFR -Exertional dyspnea and exertional back pain are her symptoms. She is doing well now with medical management -discussed stable and unstable angina, when to call 911. -continue atorvastatin 80 mg -continue aspirin 81 mg -continue metoprolol succinate at 25 mg daily -Discussed nitrates, calcium channel blockers, and ranolazine as  adjuncts -discussed that if we cannot manage her symptoms with medications, we may pursue cath. Given the extent and severity of her calcification, I suspect that bypass may be a better option than stenting, but this will be determined formally at cath. -has SL NG PRN and instructed on use -counseled on red flag warning signs that need 911 and immediate medical attention  Mixed hyperlipidemia: Per [facility], lipids 07/11/19: Tchol 168, HDL 40, LDL 64, TG 208 -continue current atorvastatin dose, recheck lipids in 1 mos  Cardiac risk counseling and prevention recommendations: has a family history of heart disease. -recommend heart healthy/Mediterranean diet, with whole grains, fruits, vegetable, fish, lean meats, nuts, and olive oil. Limit salt. -recommend moderate walking, 3-5 times/week for 30-50 minutes each session. Aim for at  least 150 minutes.week. Goal should be pace of 3 miles/hours, or walking 1.5 miles in 30 minutes -recommend avoidance of tobacco products. Avoid excess alcohol.  Plan for follow up: Labs 1 mos, f/u 6 mos  Buford Dresser, MD, PhD [place]  [facility]    Medication Adjustments/Labs and Tests Ordered: Current medicines are reviewed at length with the patient today.  Concerns regarding medicines are outlined above.  Orders Placed This Encounter  Procedures  . Lipid panel   No orders of the defined types were placed in this encounter.   Patient Instructions  Medication Instructions:  Your Physician recommend you continue on your current medication as directed.    *If you need a refill on your cardiac medications before your next appointment, please call your pharmacy*   Lab Work: Your physician recommends that you return for lab work in 1 month ( Fasting lipid)  If you have labs (blood work) drawn today and your tests are completely normal, you will receive your results only by: Marland Kitchen MyChart Message (if you have MyChart) OR . A paper copy in the mail If you have any lab test that is abnormal or we need to change your treatment, we will call you to review the results.   Testing/Procedures: None   Follow-Up: At [facility], you and your health needs are our priority.  As part of our continuing mission to provide you with exceptional heart care, we have created designated Provider Care Teams.  These Care Teams include your primary Cardiologist (physician) and Advanced Practice Providers (APPs -  Physician Assistants and Nurse Practitioners) who all work together to provide you with the care you need, when you need it.  We recommend signing up for the patient portal called "MyChart".  Sign up information is provided on this After Visit Summary.  MyChart is used to connect with patients for Virtual Visits (Telemedicine).  Patients are able to view lab/test results,  encounter notes, upcoming appointments, etc.  Non-urgent messages can be sent to your provider as well.   To learn more about what you can do with MyChart, go to [email or web address].    Your next appointment:   6 month(s)  The format for your next appointment:   In Person  Provider:   Buford Dresser, MD       Signed, Buford Dresser, MD PhD 11/12/2019    [place]

## 2019-11-12 NOTE — Patient Instructions (Signed)
Medication Instructions:  Your Physician recommend you continue on your current medication as directed.    *If you need a refill on your cardiac medications before your next appointment, please call your pharmacy*   Lab Work: Your physician recommends that you return for lab work in 1 month ( Fasting lipid)  If you have labs (blood work) drawn today and your tests are completely normal, you will receive your results only by:  [place] (if you have MyChart) OR  A paper copy in the mail If you have any lab test that is abnormal or we need to change your treatment, we will call you to review the results.   Testing/Procedures: None   Follow-Up: At [facility], you and your health needs are our priority.  As part of our continuing mission to provide you with exceptional heart care, we have created designated Provider Care Teams.  These Care Teams include your primary Cardiologist (physician) and Advanced Practice Providers (APPs -  Physician Assistants and Nurse Practitioners) who all work together to provide you with the care you need, when you need it.  We recommend signing up for the patient portal called "MyChart".  Sign up information is provided on this After Visit Summary.  MyChart is used to connect with patients for Virtual Visits (Telemedicine).  Patients are able to view lab/test results, encounter notes, upcoming appointments, etc.  Non-urgent messages can be sent to your provider as well.   To learn more about what you can do with MyChart, go to [email or web address].    Your next appointment:   6 month(s)  The format for your next appointment:   In Person  Provider:   Buford Dresser, MD

## 2019-11-14 DIAGNOSIS — M545 Low back pain: Secondary | ICD-10-CM | POA: Diagnosis not present

## 2019-11-21 DIAGNOSIS — M545 Low back pain: Secondary | ICD-10-CM | POA: Diagnosis not present

## 2019-12-25 DIAGNOSIS — Z79899 Other long term (current) drug therapy: Secondary | ICD-10-CM | POA: Diagnosis not present

## 2019-12-25 LAB — LIPID PANEL
Chol/HDL Ratio: 4 ratio (ref 0.0–4.4)
Cholesterol, Total: 161 mg/dL (ref 100–199)
HDL: 40 mg/dL (ref >39)
LDL Chol Calc (NIH): 92 mg/dL (ref 0–99)
Triglycerides: 170 mg/dL — ABNORMAL HIGH (ref 0–149)
VLDL Cholesterol Cal: 29 mg/dL (ref 5–40)

## 2020-01-15 ENCOUNTER — Other Ambulatory Visit: Payer: Self-pay

## 2020-01-15 DIAGNOSIS — Z79899 Other long term (current) drug therapy: Secondary | ICD-10-CM

## 2020-01-15 NOTE — Progress Notes (Signed)
It is possible with diet especially, but often it is hard to drop 20 pts of bad cholesterol with diet and exercise alone. I am happy to see if it changes significantly before starting another medication. Let's plan to check a fasting lipid panel in 3 mos, and if not at goal we can discuss zetia at that point. Thanks.

## 2020-01-31 ENCOUNTER — Encounter: Payer: Self-pay | Admitting: Cardiology

## 2020-02-20 DIAGNOSIS — Z23 Encounter for immunization: Secondary | ICD-10-CM | POA: Diagnosis not present

## 2020-04-27 DIAGNOSIS — Z23 Encounter for immunization: Secondary | ICD-10-CM | POA: Diagnosis not present

## 2020-04-28 ENCOUNTER — Other Ambulatory Visit: Payer: Self-pay

## 2020-04-28 DIAGNOSIS — Z79899 Other long term (current) drug therapy: Secondary | ICD-10-CM | POA: Diagnosis not present

## 2020-04-28 LAB — LIPID PANEL
Chol/HDL Ratio: 4.2 ratio (ref 0.0–4.4)
Cholesterol, Total: 172 mg/dL (ref 100–199)
HDL: 41 mg/dL (ref >39)
LDL Chol Calc (NIH): 104 mg/dL — ABNORMAL HIGH (ref 0–99)
Triglycerides: 152 mg/dL — ABNORMAL HIGH (ref 0–149)
VLDL Cholesterol Cal: 27 mg/dL (ref 5–40)

## 2020-05-06 ENCOUNTER — Other Ambulatory Visit: Payer: Self-pay

## 2020-05-06 MED ORDER — EZETIMIBE 10 MG PO TABS: 10.0000 mg | ORAL_TABLET | Freq: Every day | ORAL | 3 refills | Status: DC

## 2020-05-11 ENCOUNTER — Other Ambulatory Visit: Payer: Self-pay | Admitting: Obstetrics and Gynecology

## 2020-05-11 DIAGNOSIS — Z1231 Encounter for screening mammogram for malignant neoplasm of breast: Secondary | ICD-10-CM

## 2020-05-12 ENCOUNTER — Other Ambulatory Visit: Payer: Self-pay

## 2020-05-12 ENCOUNTER — Ambulatory Visit (INDEPENDENT_AMBULATORY_CARE_PROVIDER_SITE_OTHER): Payer: Medicare Other | Admitting: Cardiology

## 2020-05-12 ENCOUNTER — Encounter: Payer: Self-pay | Admitting: Cardiology

## 2020-05-12 VITALS — BP 134/70 | HR 84 | Ht 62.0 in | Wt 157.0 lb

## 2020-05-12 DIAGNOSIS — Z79899 Other long term (current) drug therapy: Secondary | ICD-10-CM

## 2020-05-12 DIAGNOSIS — E782 Mixed hyperlipidemia: Secondary | ICD-10-CM | POA: Diagnosis not present

## 2020-05-12 DIAGNOSIS — Z7189 Other specified counseling: Secondary | ICD-10-CM

## 2020-05-12 DIAGNOSIS — I2510 Atherosclerotic heart disease of native coronary artery without angina pectoris: Secondary | ICD-10-CM

## 2020-05-12 DIAGNOSIS — I208 Other forms of angina pectoris: Secondary | ICD-10-CM

## 2020-05-12 DIAGNOSIS — I2584 Coronary atherosclerosis due to calcified coronary lesion: Secondary | ICD-10-CM

## 2020-05-12 NOTE — Progress Notes (Signed)
Cardiology Office Note:    Date:  05/12/2020   ID:  Anne, Gibbs 29-Nov-1950, MRN [id number]  PCP:  Antony Contras, MD  Cardiologist:  Buford Dresser, MD  Referring MD: Antony Contras, MD   CC: follow up  History of Present Illness:    Anne Gibbs is a 69 y.o. female with a hx of three vessel CAD by CT, dyslipidemia, OSA not on CPAP who is seen for follow up. I initially saw her 07/16/19 as a new consult at the request of Antony Contras, MD for the evaluation and management of exertional dyspnea and exertional pain.  Cardiac history: bilateral scapular tightness and shortness of breath when walking up hill. CT cardiac with moderate calcified 3 vessel CAD, FFR with focal significant stenosis in LCX and LAD.    Today: Started ezetimibe about 4 days ago. Had one episode of vertigo after, which is very intermittent for her (years between episodes). We discussed monitoring this. We will recheck her lipids in several months. If LDL still above goal, we did discuss the possibility of PCSK9i.   Continues to have some shortness of breath/scapular pain when climbing hills, better than initial but still intermittently present. No chest/shoulder discomfort at rest.  Has some fatigue in the afternoon, occasionally takes a nap with improvement.  Denies chest pain, shortness of breath at rest or with normal exertion. No PND, orthopnea, LE edema or unexpected weight gain. No syncope or palpitations.  Past Medical History:  Diagnosis Date   High cholesterol     Past Surgical History:  Procedure Laterality Date   BREAST BIOPSY Left 2017   benign    Current Medications: Current Outpatient Medications on File Prior to Visit  Medication Sig   aspirin EC 81 MG tablet Take 81 mg by mouth daily.   atorvastatin (LIPITOR) 80 MG tablet Take 1 tablet (80 mg total) by mouth daily.   ezetimibe (ZETIA) 10 MG tablet Take 1 tablet (10 mg total) by mouth daily.   metoprolol succinate (TOPROL  XL) 25 MG 24 hr tablet Take 1 tablet (25 mg total) by mouth daily.   nitroGLYCERIN (NITROSTAT) 0.4 MG SL tablet Place 1 tablet (0.4 mg total) under the tongue every 5 (five) minutes as needed for chest pain.   ZOSTAVAX 97026 UNT/0.65ML injection    No current facility-administered medications on file prior to visit.     Allergies:   Patient has no known allergies.   Social History   Tobacco Use   Smoking status: Never Smoker   Smokeless tobacco: Never Used  Substance Use Topics   Alcohol use: No   Drug use: No    Family History: both mother and father died of MI, father was 82, mother was 2. Mother also had a history of blood clots. Sister has had recent surgery and complications, which has been stressful. Two brothers and a sister have had MI as well.   ROS:   Please see the history of present illness.  Additional pertinent ROS otherwise unremarkable.   EKGs/Labs/Other Studies Reviewed:    The following studies were reviewed today: CTA cardiac 10/09/19 FINDINGS: Coronary calcium score: The patient's coronary artery calcium score is 966, which places the patient in the 97th percentile.   Coronary arteries: Normal coronary origins.  Right dominance.   Right Coronary Artery: Normal caliber vessel, gives rise to PDA. Focal calcified plaque in the proximal portion with 25-49% stenosis.   Left Main Coronary Artery: Normal caliber vessel. Calcified plaque at ostial  LM and distal LM. Ostial plaque with trivial stenosis, but distal LM with 25-40% stenosis.   Left Anterior Descending Coronary Artery: Normal caliber vessel. Diffuse predominantly calcified plaque throughout proximal and mid portion. Most severe stenosis appears to be 50-69%. Gives rise to medium D1 and small D2 diagonal branches. D1 lumen not visible beyond most proximal portion.   Left Circumflex Artery: Normal caliber vessel. Focal proximal and mid portion calcified plaque. Most significant stenosis appears  to be 25-40% at the takeoff from the left main, but there is a proximal portion of the LCX not well visualized except in one R-R interval (65%). Cannot exclude stenosis in this location. Gives rise to 1 OM branch.   Aorta: Normal size, 29 mm at the mid ascending aorta (level of the PA bifurcation) measured double oblique. Scattered calcifications. No dissection.   Aortic Valve: No calcifications. Trileaflet.   Other findings:  Normal pulmonary vein drainage into the left atrium.  Normal left atrial appendage without a thrombus.  Normal size of the pulmonary artery.   IMPRESSION: 1. Moderate CAD with 3 vessel disease, CADRADS = 3. CT FFR will be performed and reported separately.  2. Coronary calcium score of 996. This was 97th percentile for age and sex matched control.  3. Normal coronary origin with right dominance.  FFR 1. Left Main:  No significant stenosis. FFR = 0.95   2. LAD: D1 small caliber and unable to be modeled. Proximal FFR = 0.82 after focal stenosis, Mid FFR = 0.75, Distal FFR = 0.69 3. LCX: Proximal FFR = 0.87, Distal FFR = 0.65 with discrete stenosis near mid portion of vessel. 4. RCA: No significant stenosis. Proximal FFR = 0.98, Mid FFR = 0.93. Distal caliber too small to be modeled.   IMPRESSION: 1. CT FFR analysis shows focal stenosis in LAD/LCX with initial FFR above 0.8 threshold, but as vessel continues, FFR becomes positive for significant flow limitation.    EKG:  EKG is personally reviewed.  The ekg ordered 05/12/20 demonstrates NSR at 85 bpm with nonspecific ST pattern  Recent Labs: 09/17/2019: BUN 11; Creatinine, Ser 0.59; Potassium 4.6; Sodium 141  Recent Lipid Panel    Component Value Date/Time   CHOL 172 04/28/2020 0856   TRIG 152 (H) 04/28/2020 0856   HDL 41 04/28/2020 0856   CHOLHDL 4.2 04/28/2020 0856   LDLCALC 104 (H) 04/28/2020 0856    Physical Exam:    VS:  BP 134/70   Pulse 84   Ht 5\' 2"  (1.575 m)   Wt 157 lb (71.2 kg)    SpO2 99%   BMI 28.72 kg/m     Wt Readings from Last 3 Encounters:  05/12/20 157 lb (71.2 kg)  11/12/19 157 lb 9.6 oz (71.5 kg)  10/16/19 159 lb (72.1 kg)    GEN: Well nourished, well developed in no acute distress HEENT: Normal, moist mucous membranes NECK: No JVD CARDIAC: regular rhythm, normal S1 and S2, no rubs or gallops. No murmur. VASCULAR: Radial and DP pulses 2+ bilaterally. No carotid bruits RESPIRATORY:  Clear to auscultation without rales, wheezing or rhonchi  ABDOMEN: Soft, non-tender, non-distended MUSCULOSKELETAL:  Ambulates independently SKIN: Warm and dry, no edema NEUROLOGIC:  Alert and oriented x 3. No focal neuro deficits noted. PSYCHIATRIC:  Normal affect    ASSESSMENT:    1. Coronary artery disease due to calcified coronary lesion   2. Medication management   3. Mixed hyperlipidemia   4. Stable angina (HCC)   5. Cardiac risk counseling  6. Counseling on health promotion and disease prevention    PLAN:    CAD, at least moderate, with heavy calcification in LAD and LCX especially and stenosis as noted by FFR -Exertional dyspnea and exertional back pain are her symptoms. She is doing well now with medical management -discussed stable and unstable angina, when to call 911. -continue atorvastatin 80 mg -continue aspirin 81 mg -continue metoprolol succinate at 25 mg daily -we have discussed additional antianginals as needed as well as cath if we cannot manage angina with medications -has SL NG PRN and instructed on use -counseled on red flag warning signs that need 911 and immediate medical attention  Mixed hyperlipidemia: Per [facility], lipids 04/04/20: Tchol 172, HDL 36, LDL 112, TG 118 -TG much improved, now within goal -prior LDL 64; on atorvastatin 80 mg and ezetimibe added. Recheck lipids. If LDL >70 may need additional agent  Cardiac risk counseling and prevention recommendations: has a family history of heart disease. -recommend heart  healthy/Mediterranean diet, with whole grains, fruits, vegetable, fish, lean meats, nuts, and olive oil. Limit salt. -recommend moderate walking, 3-5 times/week for 30-50 minutes each session. Aim for at least 150 minutes.week. Goal should be pace of 3 miles/hours, or walking 1.5 miles in 30 minutes -recommend avoidance of tobacco products. Avoid excess alcohol.  Plan for follow up: f/u 6 mos  Buford Dresser, MD, PhD [place]  [facility]    Medication Adjustments/Labs and Tests Ordered: Current medicines are reviewed at length with the patient today.  Concerns regarding medicines are outlined above.  Orders Placed This Encounter  Procedures   Lipid panel   No orders of the defined types were placed in this encounter.   Patient Instructions  Medication Instructions:  Your Physician recommend you continue on your current medication as directed.    *If you need a refill on your cardiac medications before your next appointment, please call your pharmacy*   Lab Work: Your physician recommends that you return for lab work in 3 months (fasting lipids).  If you have labs (blood work) drawn today and your tests are completely normal, you will receive your results only by: [place] (if you have MyChart) OR A paper copy in the mail If you have any lab test that is abnormal or we need to change your treatment, we will call you to review the results.   Testing/Procedures: None   Follow-Up: At [facility], you and your health needs are our priority.  As part of our continuing mission to provide you with exceptional heart care, we have created designated Provider Care Teams.  These Care Teams include your primary Cardiologist (physician) and Advanced Practice Providers (APPs -  Physician Assistants and Nurse Practitioners) who all work together to provide you with the care you need, when you need it.  We recommend signing up for the patient portal called "MyChart".   Sign up information is provided on this After Visit Summary.  MyChart is used to connect with patients for Virtual Visits (Telemedicine).  Patients are able to view lab/test results, encounter notes, upcoming appointments, etc.  Non-urgent messages can be sent to your provider as well.   To learn more about what you can do with MyChart, go to [email or web address].    Your next appointment:   6 month(s)  The format for your next appointment:   In Person  Provider:   Buford Dresser, MD   Signed, Buford Dresser, MD PhD 05/12/2020    [place]  [facility]

## 2020-05-12 NOTE — Patient Instructions (Signed)
Medication Instructions:  Your Physician recommend you continue on your current medication as directed.    *If you need a refill on your cardiac medications before your next appointment, please call your pharmacy*   Lab Work: Your physician recommends that you return for lab work in 3 months (fasting lipids).  If you have labs (blood work) drawn today and your tests are completely normal, you will receive your results only by: Marland Kitchen MyChart Message (if you have MyChart) OR . A paper copy in the mail If you have any lab test that is abnormal or we need to change your treatment, we will call you to review the results.   Testing/Procedures: None   Follow-Up: At [facility], you and your health needs are our priority.  As part of our continuing mission to provide you with exceptional heart care, we have created designated Provider Care Teams.  These Care Teams include your primary Cardiologist (physician) and Advanced Practice Providers (APPs -  Physician Assistants and Nurse Practitioners) who all work together to provide you with the care you need, when you need it.  We recommend signing up for the patient portal called "MyChart".  Sign up information is provided on this After Visit Summary.  MyChart is used to connect with patients for Virtual Visits (Telemedicine).  Patients are able to view lab/test results, encounter notes, upcoming appointments, etc.  Non-urgent messages can be sent to your provider as well.   To learn more about what you can do with MyChart, go to [email or web address].    Your next appointment:   6 month(s)  The format for your next appointment:   In Person  Provider:   Buford Dresser, MD

## 2020-06-12 DIAGNOSIS — I2510 Atherosclerotic heart disease of native coronary artery without angina pectoris: Secondary | ICD-10-CM | POA: Diagnosis not present

## 2020-06-12 DIAGNOSIS — M25561 Pain in right knee: Secondary | ICD-10-CM | POA: Diagnosis not present

## 2020-06-23 ENCOUNTER — Other Ambulatory Visit: Payer: Self-pay

## 2020-06-23 ENCOUNTER — Ambulatory Visit
Admission: RE | Admit: 2020-06-23 | Discharge: 2020-06-23 | Disposition: A | Discharge status: Home / Self Care | Payer: Medicare Other | Source: Ambulatory Visit | Attending: Obstetrics and Gynecology | Admitting: Obstetrics and Gynecology

## 2020-06-23 DIAGNOSIS — Z1231 Encounter for screening mammogram for malignant neoplasm of breast: Secondary | ICD-10-CM

## 2020-07-03 DIAGNOSIS — M25561 Pain in right knee: Secondary | ICD-10-CM | POA: Diagnosis not present

## 2020-07-14 DIAGNOSIS — I2510 Atherosclerotic heart disease of native coronary artery without angina pectoris: Secondary | ICD-10-CM | POA: Diagnosis not present

## 2020-07-14 DIAGNOSIS — K219 Gastro-esophageal reflux disease without esophagitis: Secondary | ICD-10-CM | POA: Diagnosis not present

## 2020-07-14 DIAGNOSIS — G4730 Sleep apnea, unspecified: Secondary | ICD-10-CM | POA: Diagnosis not present

## 2020-07-14 DIAGNOSIS — Z0000 Encounter for general adult medical examination without abnormal findings: Secondary | ICD-10-CM | POA: Diagnosis not present

## 2020-07-14 DIAGNOSIS — Z85828 Personal history of other malignant neoplasm of skin: Secondary | ICD-10-CM | POA: Diagnosis not present

## 2020-07-14 DIAGNOSIS — Z1211 Encounter for screening for malignant neoplasm of colon: Secondary | ICD-10-CM | POA: Diagnosis not present

## 2020-07-14 DIAGNOSIS — M8588 Other specified disorders of bone density and structure, other site: Secondary | ICD-10-CM | POA: Diagnosis not present

## 2020-07-14 DIAGNOSIS — R030 Elevated blood-pressure reading, without diagnosis of hypertension: Secondary | ICD-10-CM | POA: Diagnosis not present

## 2020-07-14 DIAGNOSIS — E559 Vitamin D deficiency, unspecified: Secondary | ICD-10-CM | POA: Diagnosis not present

## 2020-07-14 DIAGNOSIS — E782 Mixed hyperlipidemia: Secondary | ICD-10-CM | POA: Diagnosis not present

## 2020-07-22 DIAGNOSIS — Z780 Asymptomatic menopausal state: Secondary | ICD-10-CM | POA: Diagnosis not present

## 2020-07-22 DIAGNOSIS — M8589 Other specified disorders of bone density and structure, multiple sites: Secondary | ICD-10-CM | POA: Diagnosis not present

## 2020-07-25 DIAGNOSIS — M25561 Pain in right knee: Secondary | ICD-10-CM | POA: Diagnosis not present

## 2020-07-31 DIAGNOSIS — S83231A Complex tear of medial meniscus, current injury, right knee, initial encounter: Secondary | ICD-10-CM | POA: Diagnosis not present

## 2020-07-31 DIAGNOSIS — M2241 Chondromalacia patellae, right knee: Secondary | ICD-10-CM | POA: Diagnosis not present

## 2020-08-19 DIAGNOSIS — S83241D Other tear of medial meniscus, current injury, right knee, subsequent encounter: Secondary | ICD-10-CM | POA: Diagnosis not present

## 2020-09-04 ENCOUNTER — Ambulatory Visit: Payer: Medicare Other | Admitting: Cardiology

## 2020-09-07 DIAGNOSIS — S83231A Complex tear of medial meniscus, current injury, right knee, initial encounter: Secondary | ICD-10-CM | POA: Diagnosis not present

## 2020-09-07 DIAGNOSIS — M948X6 Other specified disorders of cartilage, lower leg: Secondary | ICD-10-CM | POA: Diagnosis not present

## 2020-09-07 DIAGNOSIS — M2241 Chondromalacia patellae, right knee: Secondary | ICD-10-CM | POA: Diagnosis not present

## 2020-09-07 DIAGNOSIS — Y999 Unspecified external cause status: Secondary | ICD-10-CM | POA: Diagnosis not present

## 2020-09-07 DIAGNOSIS — X58XXXA Exposure to other specified factors, initial encounter: Secondary | ICD-10-CM | POA: Diagnosis not present

## 2020-09-07 DIAGNOSIS — G8918 Other acute postprocedural pain: Secondary | ICD-10-CM | POA: Diagnosis not present

## 2020-09-07 DIAGNOSIS — M94261 Chondromalacia, right knee: Secondary | ICD-10-CM | POA: Diagnosis not present

## 2020-10-09 ENCOUNTER — Other Ambulatory Visit: Payer: Self-pay | Admitting: Cardiology

## 2020-10-09 DIAGNOSIS — I2584 Coronary atherosclerosis due to calcified coronary lesion: Secondary | ICD-10-CM

## 2020-10-09 DIAGNOSIS — I208 Other forms of angina pectoris: Secondary | ICD-10-CM

## 2020-10-09 DIAGNOSIS — I2510 Atherosclerotic heart disease of native coronary artery without angina pectoris: Secondary | ICD-10-CM

## 2020-10-09 DIAGNOSIS — E782 Mixed hyperlipidemia: Secondary | ICD-10-CM

## 2020-10-30 ENCOUNTER — Telehealth: Payer: Self-pay | Admitting: Cardiology

## 2020-10-30 NOTE — Telephone Encounter (Signed)
Patient has 6 month visit scheduled 11/11/20 - pending lipid panel order - unsure if this is needed as one was completed/scanned in from Feb 2022  Routed to MD/RN to review and advise

## 2020-10-30 NOTE — Telephone Encounter (Signed)
New message:    Patient would like a order for labs put in before her apt.

## 2020-10-30 NOTE — Telephone Encounter (Signed)
The scanned lipids are fine. Thanks. No need for lipids prior to visit.

## 2020-10-30 NOTE — Telephone Encounter (Signed)
Patient called and made aware no additional labs are needed

## 2020-11-10 NOTE — Progress Notes (Signed)
Cardiology Office Note:    Date:  12/03/2020   ID:  Anne Gibbs August 25, 1950, MRN [id number]  PCP:  Antony Contras, MD  Cardiologist:  Buford Dresser, MD  Referring MD: Antony Contras, MD   CC: follow up  History of Present Illness:    Anne Gibbs is a 70 y.o. female with a hx of three vessel CAD by CT, dyslipidemia, OSA not on CPAP who is seen for follow up. I initially saw her 07/16/19 as a new consult at the request of Antony Contras, MD for the evaluation and management of exertional dyspnea and exertional pain.  Cardiac history: bilateral scapular tightness and shortness of breath when walking up hill. CT cardiac with moderate calcified 3 vessel CAD, FFR with focal significant stenosis in LCX and LAD.  Stable angina improved with addition of metoprolol.  Today: She appears well, and states she has been making some lifestyle changes. Lately she does not walk as fast uphill, and she monitors the humidity outdoors. This week she also begins exercise classes. We discussed further dietary options for her as well.  For her cholesterol management, she would prefer to stay on her current medication for another 6 months.  She continues to have episodes of vertigo and lightheadedness, but this has decreased to once every 2-3 weeks.  She denies any chest pain, shortness of breath, palpitations, or exertional symptoms. No headaches, or syncope to report. Also has no lower extremity edema, orthopnea or PND.  She needs another prescription for nitroglycerin.  Past Medical History:  Diagnosis Date   High cholesterol     Past Surgical History:  Procedure Laterality Date   BREAST BIOPSY Left 2017   benign    Current Medications: Current Outpatient Medications on File Prior to Visit  Medication Sig   aspirin EC 81 MG tablet Take 81 mg by mouth daily.   atorvastatin (LIPITOR) 80 MG tablet TAKE 1 TABLET(80 MG) BY MOUTH DAILY   metoprolol succinate (TOPROL-XL) 25 MG 24 hr  tablet TAKE 1 TABLET(25 MG) BY MOUTH DAILY   ZOSTAVAX 39767 UNT/0.65ML injection    ezetimibe (ZETIA) 10 MG tablet Take 1 tablet (10 mg total) by mouth daily.   No current facility-administered medications on file prior to visit.     Allergies:   Patient has no known allergies.   Social History   Tobacco Use   Smoking status: Never   Smokeless tobacco: Never  Substance Use Topics   Alcohol use: No   Drug use: No    Family History: both mother and father died of MI, father was 63, mother was 50. Mother also had a history of blood clots. Sister has had recent surgery and complications, which has been stressful. Two brothers and a sister have had MI as well.   ROS:   Please see the history of present illness.   (+) Lightheadedness Additional pertinent ROS otherwise unremarkable.   EKGs/Labs/Other Studies Reviewed:    The following studies were reviewed today:  CTA cardiac 10/09/19 FINDINGS: Coronary calcium score: The patient's coronary artery calcium score is 966, which places the patient in the 97th percentile.   Coronary arteries: Normal coronary origins.  Right dominance.   Right Coronary Artery: Normal caliber vessel, gives rise to PDA. Focal calcified plaque in the proximal portion with 25-49% stenosis.   Left Main Coronary Artery: Normal caliber vessel. Calcified plaque at ostial LM and distal LM. Ostial plaque with trivial stenosis, but distal LM with 25-40% stenosis.   Left  Anterior Descending Coronary Artery: Normal caliber vessel. Diffuse predominantly calcified plaque throughout proximal and mid portion. Most severe stenosis appears to be 50-69%. Gives rise to medium D1 and small D2 diagonal branches. D1 lumen not visible beyond most proximal portion.   Left Circumflex Artery: Normal caliber vessel. Focal proximal and mid portion calcified plaque. Most significant stenosis appears to be 25-40% at the takeoff from the left main, but there is a  proximal portion of the LCX not well visualized except in one R-R interval (65%). Cannot exclude stenosis in this location. Gives rise to 1 OM branch.   Aorta: Normal size, 29 mm at the mid ascending aorta (level of the PA bifurcation) measured double oblique. Scattered calcifications. No dissection.   Aortic Valve: No calcifications. Trileaflet.   Other findings:  Normal pulmonary vein drainage into the left atrium.  Normal left atrial appendage without a thrombus.  Normal size of the pulmonary artery.   IMPRESSION: 1. Moderate CAD with 3 vessel disease, CADRADS = 3. CT FFR will be performed and reported separately.  2. Coronary calcium score of 996. This was 97th percentile for age and sex matched control.  3. Normal coronary origin with right dominance.  FFR 10/09/2019: 1. Left Main:  No significant stenosis. FFR = 0.95   2. LAD: D1 small caliber and unable to be modeled. Proximal FFR = 0.82 after focal stenosis, Mid FFR = 0.75, Distal FFR = 0.69 3. LCX: Proximal FFR = 0.87, Distal FFR = 0.65 with discrete stenosis near mid portion of vessel. 4. RCA: No significant stenosis. Proximal FFR = 0.98, Mid FFR = 0.93. Distal caliber too small to be modeled.   IMPRESSION: 1. CT FFR analysis shows focal stenosis in LAD/LCX with initial FFR above 0.8 threshold, but as vessel continues, FFR becomes positive for significant flow limitation.    EKG:  EKG is personally reviewed.   11/11/2020: sinus bradycardia at 58 bpm 07/16/19: NSR  Recent Labs: No results found for requested labs within last 8760 hours.  Recent Lipid Panel    Component Value Date/Time   CHOL 172 04/28/2020 0856   TRIG 152 (H) 04/28/2020 0856   HDL 41 04/28/2020 0856   CHOLHDL 4.2 04/28/2020 0856   LDLCALC 104 (H) 04/28/2020 0856    Physical Exam:    VS:  BP 134/72 (BP Location: Right Arm, Patient Position: Sitting)   Pulse (!) 58   Ht 5\' 2"  (1.575 m)   Wt 150 lb 9.6 oz (68.3 kg)   SpO2 96%   BMI 27.55  kg/m     Wt Readings from Last 3 Encounters:  11/11/20 150 lb 9.6 oz (68.3 kg)  05/12/20 157 lb (71.2 kg)  11/12/19 157 lb 9.6 oz (71.5 kg)    GEN: Well nourished, well developed in no acute distress HEENT: Normal, moist mucous membranes NECK: No JVD CARDIAC: regular rhythm, normal S1 and S2, no rubs or gallops. Soft systolic murmur. VASCULAR: Radial and DP pulses 2+ bilaterally. No carotid bruits RESPIRATORY:  Clear to auscultation without rales, wheezing or rhonchi  ABDOMEN: Soft, non-tender, non-distended MUSCULOSKELETAL:  Ambulates independently SKIN: Warm and dry, no edema NEUROLOGIC:  Alert and oriented x 3. No focal neuro deficits noted. PSYCHIATRIC:  Normal affect   ASSESSMENT:    1. Mixed hyperlipidemia   2. Coronary artery disease due to calcified coronary lesion   3. Stable angina ([place])   4. Family history of heart disease   5. Cardiac risk counseling   6. Counseling on health  promotion and disease prevention    PLAN:    CAD, at least moderate, with heavy calcification in LAD and LCX especially and stenosis as noted by FFR -Exertional dyspnea and exertional back pain are her symptoms. She is doing well now with medical management -discussed stable and unstable angina, when to call 911. -continue atorvastatin 80 mg, ezetimibe 10 mg daily -continue aspirin 81 mg -continue metoprolol succinate 25 mg daily -Discussed nitrates, calcium channel blockers, and ranolazine as adjuncts -discussed that if we cannot manage her symptoms with medications, we may pursue cath. Given the extent and severity of her calcification, I suspect that bypass may be a better option than stenting, but this will be determined formally at cath. -has SL NG PRN and instructed on use -counseled on red flag warning signs that need 911 and immediate medical attention  Mixed hyperlipidemia: Per [facility],  Lipids 07/14/20: Tchol 155, HDL 55, LDL 79, TG 185 lipids 07/11/19: Tchol 168, HDL 40, LDL 64, TG  208 -continue atorvastatin, ezetimibe -working on lifestyle -recheck lipids prior to follow up  Cardiac risk counseling and prevention recommendations: has a family history of heart disease. -recommend heart healthy/Mediterranean diet, with whole grains, fruits, vegetable, fish, lean meats, nuts, and olive oil. Limit salt. -recommend moderate walking, 3-5 times/week for 30-50 minutes each session. Aim for at least 150 minutes.week. Goal should be pace of 3 miles/hours, or walking 1.5 miles in 30 minutes -recommend avoidance of tobacco products. Avoid excess alcohol.  Plan for follow up: 6 mos or sooner PRN  Buford Dresser, MD, PhD [place]  [facility]    Medication Adjustments/Labs and Tests Ordered: Current medicines are reviewed at length with the patient today.  Concerns regarding medicines are outlined above.  Orders Placed This Encounter  Procedures   Lipid panel   EKG 12-Lead   Meds ordered this encounter  Medications   nitroGLYCERIN (NITROSTAT) 0.4 MG SL tablet    Sig: Place 1 tablet (0.4 mg total) under the tongue every 5 (five) minutes as needed for chest pain.    Dispense:  90 tablet    Refill:  3    Patient Instructions  Medication Instructions:  Your Physician recommend you continue on your current medication as directed.    *If you need a refill on your cardiac medications before your next appointment, please call your pharmacy*   Lab Work: Your physician recommends that you return for lab work prior to your 6 month follow up appointment (Fasting lipid).  If you have labs (blood work) drawn today and your tests are completely normal, you will receive your results only by: [place] (if you have MyChart) OR A paper copy in the mail If you have any lab test that is abnormal or we need to change your treatment, we will call you to review the results.   Testing/Procedures: None ordered today   Follow-Up: At [facility], you and your  health needs are our priority.  As part of our continuing mission to provide you with exceptional heart care, we have created designated Provider Care Teams.  These Care Teams include your primary Cardiologist (physician) and Advanced Practice Providers (APPs -  Physician Assistants and Nurse Practitioners) who all work together to provide you with the care you need, when you need it.  We recommend signing up for the patient portal called "MyChart".  Sign up information is provided on this After Visit Summary.  MyChart is used to connect with patients for Virtual Visits (Telemedicine).  Patients are able to view lab/test results, encounter notes, upcoming appointments, etc.  Non-urgent messages can be sent to your provider as well.   To learn more about what you can do with MyChart, go to [email or web address].    Your next appointment:   6 month(s) @ [address] [place], [place] [address]   The format for your next appointment:   In Person  Provider:   Buford Dresser, MD      [facility],acting as a scribe for Buford Dresser, MD.,have documented all relevant documentation on the behalf of Buford Dresser, MD,as directed by  Buford Dresser, MD while in the presence of Buford Dresser, MD.  I, Buford Dresser, MD, have reviewed all documentation for this visit. The documentation on 12/03/20 for the exam, diagnosis, procedures, and orders are all accurate and complete.   Signed, Buford Dresser, MD PhD 12/03/2020    [facility]

## 2020-11-11 ENCOUNTER — Ambulatory Visit (INDEPENDENT_AMBULATORY_CARE_PROVIDER_SITE_OTHER): Payer: Medicare Other | Admitting: Cardiology

## 2020-11-11 ENCOUNTER — Encounter: Payer: Self-pay | Admitting: Cardiology

## 2020-11-11 ENCOUNTER — Other Ambulatory Visit: Payer: Self-pay

## 2020-11-11 VITALS — BP 134/72 | HR 58 | Ht 62.0 in | Wt 150.6 lb

## 2020-11-11 DIAGNOSIS — Z7189 Other specified counseling: Secondary | ICD-10-CM | POA: Diagnosis not present

## 2020-11-11 DIAGNOSIS — I2584 Coronary atherosclerosis due to calcified coronary lesion: Secondary | ICD-10-CM | POA: Diagnosis not present

## 2020-11-11 DIAGNOSIS — Z8249 Family history of ischemic heart disease and other diseases of the circulatory system: Secondary | ICD-10-CM

## 2020-11-11 DIAGNOSIS — I2510 Atherosclerotic heart disease of native coronary artery without angina pectoris: Secondary | ICD-10-CM | POA: Diagnosis not present

## 2020-11-11 DIAGNOSIS — I208 Other forms of angina pectoris: Secondary | ICD-10-CM | POA: Diagnosis not present

## 2020-11-11 DIAGNOSIS — E782 Mixed hyperlipidemia: Secondary | ICD-10-CM | POA: Diagnosis not present

## 2020-11-11 MED ORDER — NITROGLYCERIN 0.4 MG SL SUBL: 0.4000 mg | SUBLINGUAL_TABLET | SUBLINGUAL | 3 refills | Status: DC | PRN

## 2020-11-11 NOTE — Patient Instructions (Signed)
Medication Instructions:  Your Physician recommend you continue on your current medication as directed.    *If you need a refill on your cardiac medications before your next appointment, please call your pharmacy*   Lab Work: Your physician recommends that you return for lab work prior to your 6 month follow up appointment (Fasting lipid).  If you have labs (blood work) drawn today and your tests are completely normal, you will receive your results only by: Marland Kitchen MyChart Message (if you have MyChart) OR . A paper copy in the mail If you have any lab test that is abnormal or we need to change your treatment, we will call you to review the results.   Testing/Procedures: None ordered today   Follow-Up: At [facility], you and your health needs are our priority.  As part of our continuing mission to provide you with exceptional heart care, we have created designated Provider Care Teams.  These Care Teams include your primary Cardiologist (physician) and Advanced Practice Providers (APPs -  Physician Assistants and Nurse Practitioners) who all work together to provide you with the care you need, when you need it.  We recommend signing up for the patient portal called "MyChart".  Sign up information is provided on this After Visit Summary.  MyChart is used to connect with patients for Virtual Visits (Telemedicine).  Patients are able to view lab/test results, encounter notes, upcoming appointments, etc.  Non-urgent messages can be sent to your provider as well.   To learn more about what you can do with MyChart, go to [email or web address].    Your next appointment:   6 month(s) @ [address] [place], [place] [address]   The format for your next appointment:   In Person  Provider:   Buford Dresser, MD

## 2020-12-18 DIAGNOSIS — L989 Disorder of the skin and subcutaneous tissue, unspecified: Secondary | ICD-10-CM | POA: Diagnosis not present

## 2020-12-18 DIAGNOSIS — E782 Mixed hyperlipidemia: Secondary | ICD-10-CM | POA: Diagnosis not present

## 2021-01-19 DIAGNOSIS — B078 Other viral warts: Secondary | ICD-10-CM | POA: Diagnosis not present

## 2021-02-17 DIAGNOSIS — B078 Other viral warts: Secondary | ICD-10-CM | POA: Diagnosis not present

## 2021-02-22 DIAGNOSIS — Z23 Encounter for immunization: Secondary | ICD-10-CM | POA: Diagnosis not present

## 2021-03-19 DIAGNOSIS — M2031 Hallux varus (acquired), right foot: Secondary | ICD-10-CM | POA: Diagnosis not present

## 2021-03-19 DIAGNOSIS — M2021 Hallux rigidus, right foot: Secondary | ICD-10-CM | POA: Diagnosis not present

## 2021-03-19 DIAGNOSIS — M79671 Pain in right foot: Secondary | ICD-10-CM | POA: Diagnosis not present

## 2021-04-12 ENCOUNTER — Other Ambulatory Visit: Payer: Self-pay | Admitting: Cardiology

## 2021-04-12 NOTE — Telephone Encounter (Signed)
Rx(s) sent to pharmacy electronically.  

## 2021-04-18 ENCOUNTER — Encounter: Payer: Self-pay | Admitting: Cardiology

## 2021-05-12 ENCOUNTER — Encounter (HOSPITAL_BASED_OUTPATIENT_CLINIC_OR_DEPARTMENT_OTHER): Payer: Self-pay

## 2021-05-13 DIAGNOSIS — I2584 Coronary atherosclerosis due to calcified coronary lesion: Secondary | ICD-10-CM | POA: Diagnosis not present

## 2021-05-13 DIAGNOSIS — I2510 Atherosclerotic heart disease of native coronary artery without angina pectoris: Secondary | ICD-10-CM | POA: Diagnosis not present

## 2021-05-13 LAB — LIPID PANEL
Chol/HDL Ratio: 3.7 ratio (ref 0.0–4.4)
Cholesterol, Total: 161 mg/dL (ref 100–199)
HDL: 44 mg/dL (ref >39)
LDL Chol Calc (NIH): 89 mg/dL (ref 0–99)
Triglycerides: 162 mg/dL — ABNORMAL HIGH (ref 0–149)
VLDL Cholesterol Cal: 28 mg/dL (ref 5–40)

## 2021-05-20 ENCOUNTER — Ambulatory Visit (HOSPITAL_BASED_OUTPATIENT_CLINIC_OR_DEPARTMENT_OTHER): Payer: Medicare Other | Admitting: Cardiology

## 2021-06-16 DIAGNOSIS — M2241 Chondromalacia patellae, right knee: Secondary | ICD-10-CM | POA: Diagnosis not present

## 2021-06-16 DIAGNOSIS — M25562 Pain in left knee: Secondary | ICD-10-CM | POA: Diagnosis not present

## 2021-06-16 DIAGNOSIS — M2242 Chondromalacia patellae, left knee: Secondary | ICD-10-CM | POA: Diagnosis not present

## 2021-06-18 ENCOUNTER — Other Ambulatory Visit: Payer: Self-pay | Admitting: Obstetrics and Gynecology

## 2021-06-18 ENCOUNTER — Encounter (HOSPITAL_BASED_OUTPATIENT_CLINIC_OR_DEPARTMENT_OTHER): Payer: Self-pay | Admitting: Cardiology

## 2021-06-18 ENCOUNTER — Ambulatory Visit (INDEPENDENT_AMBULATORY_CARE_PROVIDER_SITE_OTHER): Payer: Medicare Other | Admitting: Cardiology

## 2021-06-18 ENCOUNTER — Other Ambulatory Visit: Payer: Self-pay

## 2021-06-18 VITALS — BP 136/66 | HR 56 | Ht 62.0 in | Wt 153.0 lb

## 2021-06-18 DIAGNOSIS — I2510 Atherosclerotic heart disease of native coronary artery without angina pectoris: Secondary | ICD-10-CM

## 2021-06-18 DIAGNOSIS — E782 Mixed hyperlipidemia: Secondary | ICD-10-CM | POA: Diagnosis not present

## 2021-06-18 DIAGNOSIS — I2584 Coronary atherosclerosis due to calcified coronary lesion: Secondary | ICD-10-CM | POA: Diagnosis not present

## 2021-06-18 DIAGNOSIS — Z7189 Other specified counseling: Secondary | ICD-10-CM

## 2021-06-18 DIAGNOSIS — Z1231 Encounter for screening mammogram for malignant neoplasm of breast: Secondary | ICD-10-CM

## 2021-06-18 DIAGNOSIS — I2089 Other forms of angina pectoris: Secondary | ICD-10-CM

## 2021-06-18 DIAGNOSIS — I208 Other forms of angina pectoris: Secondary | ICD-10-CM

## 2021-06-18 NOTE — Progress Notes (Signed)
Cardiology Office Note:    Date:  06/18/2021   ID:  Anne Gibbs, DOB 03-Mar-1951, MRN [id number]  PCP:  Antony Contras, MD  Cardiologist:  Buford Dresser, MD  Referring MD: Antony Contras, MD   CC: follow up  History of Present Illness:    Anne Gibbs is a 71 y.o. female with a hx of three vessel CAD by CT, dyslipidemia, OSA not on CPAP who is seen for follow up. I initially saw her 07/16/19 as a new consult at the request of Antony Contras, MD for the evaluation and management of exertional dyspnea and exertional pain.  Cardiac history: bilateral scapular tightness and shortness of breath when walking up hill. CT cardiac with moderate calcified 3 vessel CAD, FFR with focal significant stenosis in LCX and LAD.  Stable angina improved with addition of metoprolol.  Today: Overall, she is feeling pretty good aside from suffering a recent torn meniscus. At this time she has recovered well.  As long as she slows down and keeps an appropriate pace when walking, then she feels well and remains asymptomatic. She is also more aware of her limits, after a time she will develop her back pain as prior. She denies any back or chest pain while at rest.  For exercise she walks regularly. Occasionally she needs to take an afternoon nap if she has been very active that day.  Concerning her diet she is doing well avoiding fried foods and she participates in Weight Watchers.   She noticed some swelling in her fingers this morning, but this has not been typical for her.  We discussed cholesterol optimization at length with PCSK9i, see below.  She denies any palpitations, or shortness of breath. No lightheadedness, headaches, syncope, orthopnea, or PND.   Past Medical History:  Diagnosis Date   High cholesterol     Past Surgical History:  Procedure Laterality Date   BREAST BIOPSY Left 2017   benign    Current Medications: Current Outpatient Medications on File Prior to Visit   Medication Sig   Acetaminophen (TYLENOL ARTHRITIS PAIN PO) Take 650 mg by mouth as needed.   aspirin EC 81 MG tablet Take 81 mg by mouth daily.   atorvastatin (LIPITOR) 80 MG tablet TAKE 1 TABLET(80 MG) BY MOUTH DAILY   ezetimibe (ZETIA) 10 MG tablet TAKE 1 TABLET(10 MG) BY MOUTH DAILY   metoprolol succinate (TOPROL-XL) 25 MG 24 hr tablet TAKE 1 TABLET(25 MG) BY MOUTH DAILY   nitroGLYCERIN (NITROSTAT) 0.4 MG SL tablet Place 1 tablet (0.4 mg total) under the tongue every 5 (five) minutes as needed for chest pain. (Patient not taking: Reported on 06/18/2021)   ZOSTAVAX 85462 UNT/0.65ML injection  (Patient not taking: Reported on 06/18/2021)   No current facility-administered medications on file prior to visit.     Allergies:   Patient has no known allergies.   Social History   Tobacco Use   Smoking status: Never   Smokeless tobacco: Never  Substance Use Topics   Alcohol use: No   Drug use: No    Family History: both mother and father died of MI, father was 42, mother was 24. Mother also had a history of blood clots. Sister has had recent surgery and complications, which has been stressful. Two brothers and a sister have had MI as well.   ROS:   Please see the history of present illness.   (+) Mild edema in fingers Additional pertinent ROS otherwise unremarkable.   EKGs/Labs/Other Studies Reviewed:  The following studies were reviewed today:  CTA cardiac 10/09/19 FINDINGS: Coronary calcium score: The patient's coronary artery calcium score is 966, which places the patient in the 97th percentile.   Coronary arteries: Normal coronary origins.  Right dominance.   Right Coronary Artery: Normal caliber vessel, gives rise to PDA. Focal calcified plaque in the proximal portion with 25-49% stenosis.   Left Main Coronary Artery: Normal caliber vessel. Calcified plaque at ostial LM and distal LM. Ostial plaque with trivial stenosis, but distal LM with 25-40% stenosis.   Left  Anterior Descending Coronary Artery: Normal caliber vessel. Diffuse predominantly calcified plaque throughout proximal and mid portion. Most severe stenosis appears to be 50-69%. Gives rise to medium D1 and small D2 diagonal branches. D1 lumen not visible beyond most proximal portion.   Left Circumflex Artery: Normal caliber vessel. Focal proximal and mid portion calcified plaque. Most significant stenosis appears to be 25-40% at the takeoff from the left main, but there is a proximal portion of the LCX not well visualized except in one R-R interval (65%). Cannot exclude stenosis in this location. Gives rise to 1 OM branch.   Aorta: Normal size, 29 mm at the mid ascending aorta (level of the PA bifurcation) measured double oblique. Scattered calcifications. No dissection.   Aortic Valve: No calcifications. Trileaflet.   Other findings:  Normal pulmonary vein drainage into the left atrium.  Normal left atrial appendage without a thrombus.  Normal size of the pulmonary artery.   IMPRESSION: 1. Moderate CAD with 3 vessel disease, CADRADS = 3. CT FFR will be performed and reported separately.  2. Coronary calcium score of 996. This was 97th percentile for age and sex matched control.  3. Normal coronary origin with right dominance.  FFR 10/09/2019: 1. Left Main:  No significant stenosis. FFR = 0.95   2. LAD: D1 small caliber and unable to be modeled. Proximal FFR = 0.82 after focal stenosis, Mid FFR = 0.75, Distal FFR = 0.69 3. LCX: Proximal FFR = 0.87, Distal FFR = 0.65 with discrete stenosis near mid portion of vessel. 4. RCA: No significant stenosis. Proximal FFR = 0.98, Mid FFR = 0.93. Distal caliber too small to be modeled.   IMPRESSION: 1. CT FFR analysis shows focal stenosis in LAD/LCX with initial FFR above 0.8 threshold, but as vessel continues, FFR becomes positive for significant flow limitation.    EKG:  EKG is personally reviewed.   06/18/2021: sinus bradycardia  at 56 bpm 11/11/2020: sinus bradycardia at 58 bpm 07/16/19: NSR  Recent Labs: No results found for requested labs within last 8760 hours.   Recent Lipid Panel    Component Value Date/Time   CHOL 161 05/13/2021 0833   TRIG 162 (H) 05/13/2021 0833   HDL 44 05/13/2021 0833   CHOLHDL 3.7 05/13/2021 0833   LDLCALC 89 05/13/2021 0833    Physical Exam:    VS:  BP 136/66 (BP Location: Left Arm, Patient Position: Sitting, Cuff Size: Normal)    Pulse (!) 56    Ht 5\' 2"  (1.575 m)    Wt 153 lb (69.4 kg)    SpO2 98%    BMI 27.98 kg/m     Wt Readings from Last 3 Encounters:  06/18/21 153 lb (69.4 kg)  11/11/20 150 lb 9.6 oz (68.3 kg)  05/12/20 157 lb (71.2 kg)    GEN: Well nourished, well developed in no acute distress HEENT: Normal, moist mucous membranes NECK: No JVD CARDIAC: regular rhythm, normal S1 and S2, no  rubs or gallops. 1/6 systolic murmur. VASCULAR: Radial and DP pulses 2+ bilaterally. No carotid bruits RESPIRATORY:  Clear to auscultation without rales, wheezing or rhonchi  ABDOMEN: Soft, non-tender, non-distended MUSCULOSKELETAL:  Ambulates independently SKIN: Warm and dry, no edema NEUROLOGIC:  Alert and oriented x 3. No focal neuro deficits noted. PSYCHIATRIC:  Normal affect   ASSESSMENT:    1. Coronary artery disease due to calcified coronary lesion   2. Stable angina ([place])   3. Mixed hyperlipidemia   4. Cardiac risk counseling   5. Counseling on health promotion and disease prevention     PLAN:    CAD, at least moderate, with heavy calcification in LAD and LCX especially and stenosis as noted by FFR -Exertional dyspnea and exertional back pain are her symptoms. She is doing well now with medical management -discussed stable and unstable angina, when to call 911. -continue atorvastatin 80 mg, ezetimibe 10 mg daily -continue aspirin 81 mg -continue metoprolol succinate 25 mg daily -Discussed nitrates, calcium channel blockers, and ranolazine as  adjuncts -discussed that if we cannot manage her symptoms with medications, we may pursue cath. Given the extent and severity of her calcification, I suspect that bypass may be a better option than stenting, but this will be determined formally at cath. -has SL NG PRN and instructed on use -counseled on red flag warning signs that need 911 and immediate medical attention  Mixed hyperlipidemia: Per [facility],  Lipids 05/13/21: Tchol 161, HDL 44, LDL 89, TG 162 Lipids 07/14/20: Tchol 155, HDL 55, LDL 79, TG 185 lipids 07/11/19: Tchol 168, HDL 40, LDL 64, TG 208 -continue atorvastatin, ezetimibe -we discussed PCSK9i today, both antibody and mRNA based. She would be interested in these if they are covered by her insurance. As she has ASCVD and is not at goal of LDL <70 on max dose of atorvastatin and ezetimibe, I think PCSK9i would be the next step for her.  Cardiac risk counseling and prevention recommendations: has a family history of heart disease. -recommend heart healthy/Mediterranean diet, with whole grains, fruits, vegetable, fish, lean meats, nuts, and olive oil. Limit salt. -recommend moderate walking, 3-5 times/week for 30-50 minutes each session. Aim for at least 150 minutes.week. Goal should be pace of 3 miles/hours, or walking 1.5 miles in 30 minutes -recommend avoidance of tobacco products. Avoid excess alcohol.  Plan for follow up: 6 mos or sooner PRN  Buford Dresser, MD, PhD [place]   [facility]    Medication Adjustments/Labs and Tests Ordered: Current medicines are reviewed at length with the patient today.  Concerns regarding medicines are outlined above.   Orders Placed This Encounter  Procedures   EKG 12-Lead   No orders of the defined types were placed in this encounter.  Patient Instructions   Medication Instructions:  Your Physician recommend you continue on your current medication as directed.    We will contact our pharmacist to discuss PCSK9 options      *If you need a refill on your cardiac medications before your next appointment, please call your pharmacy*   Lab Work: None ordered today   Testing/Procedures: None ordered today   Follow-Up: At [facility], you and your health needs are our priority.  As part of our continuing mission to provide you with exceptional heart care, we have created designated Provider Care Teams.  These Care Teams include your primary Cardiologist (physician) and Advanced Practice Providers (APPs -  Physician Assistants and Nurse Practitioners) who all work together to provide  you with the care you need, when you need it.  We recommend signing up for the patient portal called "MyChart".  Sign up information is provided on this After Visit Summary.  MyChart is used to connect with patients for Virtual Visits (Telemedicine).  Patients are able to view lab/test results, encounter notes, upcoming appointments, etc.  Non-urgent messages can be sent to your provider as well.   To learn more about what you can do with MyChart, go to [email or web address].    Your next appointment:   6 month(s)  The format for your next appointment:   In Person  Provider:   Buford Dresser, MD       [facility] Stumpf,acting as a scribe for Buford Dresser, MD.,have documented all relevant documentation on the behalf of Buford Dresser, MD,as directed by  Buford Dresser, MD while in the presence of Buford Dresser, MD.  I, Buford Dresser, MD, have reviewed all documentation for this visit. The documentation on 06/18/21 for the exam, diagnosis, procedures, and orders are all accurate and complete.   Signed, Buford Dresser, MD PhD 06/18/2021    [place]

## 2021-06-18 NOTE — Patient Instructions (Addendum)
Medication Instructions:  Your Physician recommend you continue on your current medication as directed.    We will contact our pharmacist to discuss PCSK9 options     *If you need a refill on your cardiac medications before your next appointment, please call your pharmacy*   Lab Work: None ordered today   Testing/Procedures: None ordered today   Follow-Up: At [facility], you and your health needs are our priority.  As part of our continuing mission to provide you with exceptional heart care, we have created designated Provider Care Teams.  These Care Teams include your primary Cardiologist (physician) and Advanced Practice Providers (APPs -  Physician Assistants and Nurse Practitioners) who all work together to provide you with the care you need, when you need it.  We recommend signing up for the patient portal called "MyChart".  Sign up information is provided on this After Visit Summary.  MyChart is used to connect with patients for Virtual Visits (Telemedicine).  Patients are able to view lab/test results, encounter notes, upcoming appointments, etc.  Non-urgent messages can be sent to your provider as well.   To learn more about what you can do with MyChart, go to [email or web address].    Your next appointment:   6 month(s)  The format for your next appointment:   In Person  Provider:   Buford Dresser, MD

## 2021-07-05 ENCOUNTER — Ambulatory Visit
Admission: RE | Admit: 2021-07-05 | Discharge: 2021-07-05 | Disposition: A | Discharge status: Home / Self Care | Payer: Medicare Other | Source: Ambulatory Visit | Attending: Obstetrics and Gynecology | Admitting: Obstetrics and Gynecology

## 2021-07-05 ENCOUNTER — Other Ambulatory Visit: Payer: Self-pay

## 2021-07-05 DIAGNOSIS — Z1231 Encounter for screening mammogram for malignant neoplasm of breast: Secondary | ICD-10-CM | POA: Diagnosis not present

## 2021-07-15 ENCOUNTER — Other Ambulatory Visit: Payer: Self-pay | Admitting: Cardiology

## 2021-07-15 DIAGNOSIS — I2584 Coronary atherosclerosis due to calcified coronary lesion: Secondary | ICD-10-CM

## 2021-07-15 DIAGNOSIS — I208 Other forms of angina pectoris: Secondary | ICD-10-CM

## 2021-07-15 DIAGNOSIS — I2510 Atherosclerotic heart disease of native coronary artery without angina pectoris: Secondary | ICD-10-CM

## 2021-07-15 NOTE — Telephone Encounter (Signed)
Rx(s) sent to pharmacy electronically.  

## 2021-07-16 ENCOUNTER — Ambulatory Visit (INDEPENDENT_AMBULATORY_CARE_PROVIDER_SITE_OTHER): Payer: Medicare Other | Admitting: Pharmacist Clinician (PhC)/ Clinical Pharmacy Specialist

## 2021-07-16 ENCOUNTER — Other Ambulatory Visit: Payer: Self-pay

## 2021-07-16 DIAGNOSIS — I208 Other forms of angina pectoris: Secondary | ICD-10-CM

## 2021-07-16 DIAGNOSIS — E785 Hyperlipidemia, unspecified: Secondary | ICD-10-CM | POA: Diagnosis not present

## 2021-07-16 NOTE — Patient Instructions (Addendum)
Your Results:             Your most recent labs Goal  Total Cholesterol 161 < 200  Triglycerides 162 < 150  HDL (happy/good cholesterol) 44 > 40  LDL (lousy/bad cholesterol 89 < 55   Medications:  Repatha/Praluent - every 2 week injection done at home, would have $500 deductible, but can use charitable grant to cover costs, drops LDL 50%  Nexletol - once daily tablet, does have increased risk of gout, drops LDL 30-35%  Leqvio - injection every 6 months (after first 2 at 3 months apart), would most likely be free with your plan, drops LDL 50%  When you make a decision, please call me at [phone] Erasmo Downer or [place])  Patient Assistance:  The [employer] Well foundation offers assistance to help pay for medication copays.  They will cover copays for all cholesterol lowering meds, including statins, fibrates, omega-3 oils, ezetimibe, Repatha, Praluent, Nexletol, Nexlizet.  The cards are usually good for $2,500 or 12 months, whichever comes first. Go to healthwellfoundation.org Click on Apply Now Answer questions as to whom is applying (patient or representative) Your disease fund will be hypercholesterolemia - Medicare access They will ask questions about finances and which medications you are taking for cholesterol When you submit, the approval is usually within minutes.  You will need to print the card information from the site You will need to show this information to your pharmacy, they will bill your Medicare Part D plan first -then bill Health Well --for the copay.   You can also call them at [phone], although the hold times can be quite long.   Thank you for choosing [facility]

## 2021-07-16 NOTE — Progress Notes (Signed)
07/17/2021 ADNA NOFZIGER 1950/09/11 [id number]   HPI:  Anne Gibbs is a 71 y.o. female patient of Dr Harrell Gave, who presents today for a lipid clinic evaluation.  See pertinent past medical history below.    Past Medical History: CAD 3 vessel disease by CT, coronary calcium score 996 (97th percentile)  OSA Not on CPAP   Current Medications: atorvastatin 80 mg qd, ezetimibe 10 mg qd (compliant with both)  Cholesterol Goals:  LDL < 55  Family history: father had chronic angina, MI, DM, CHF; mother had aneurysms x 3, brothers both with MI/stents, sister with MI/stents all three still living, oldest brother has continued health issues, as does sister.  2 daughters, older has DM and its complications  Diet: mix, but has been on Weight Watchers (lifetime member); over past 4-5 years has stayed within 5 lb of goal weight;   Exercise:  gym 2 days per week, walks other days at [place]: 12/22:  TC 161, TG 162, HDL 44, LDL 89   Current Outpatient Medications  Medication Sig Dispense Refill   Acetaminophen (TYLENOL ARTHRITIS PAIN PO) Take 650 mg by mouth as needed.     aspirin EC 81 MG tablet Take 81 mg by mouth daily.     atorvastatin (LIPITOR) 80 MG tablet TAKE 1 TABLET(80 MG) BY MOUTH DAILY 90 tablet 2   ezetimibe (ZETIA) 10 MG tablet TAKE 1 TABLET(10 MG) BY MOUTH DAILY 90 tablet 1   metoprolol succinate (TOPROL-XL) 25 MG 24 hr tablet TAKE 1 TABLET(25 MG) BY MOUTH DAILY 90 tablet 3   Multiple Vitamin tablet Take 1 tablet by mouth daily.     nitroGLYCERIN (NITROSTAT) 0.4 MG SL tablet Place 1 tablet (0.4 mg total) under the tongue every 5 (five) minutes as needed for chest pain. 90 tablet 3   No current facility-administered medications for this visit.    No Known Allergies  Past Medical History:  Diagnosis Date   High cholesterol     There were no vitals taken for this visit.   Hyperlipidemia Patient with CAD, elevated coronary calcium score and LDL not at goal despite  high intensity statin with ezetimibe. Reviewed options for lowering LDL cholesterol, including ezetimibe, PCSK-9 inhibitors, bempedoic acid and inclisiran.  Discussed mechanisms of action, dosing, side effects and potential decreases in LDL cholesterol.  Also reviewed cost information and potential options for patient assistance.  Answered all patient questions.  Based on this information, patient would like to review the options for a few days before making decision.  She has a Plan N Medicare supplement that would give Leqvio at no charge, and would also be eligible for [place] should she choose PCSK9 or bempedoic acid.  She will reach out to me with her decision in the next week or two   Tommy Medal PharmD CPP [place] [address] [place], [place] [address] [phone]

## 2021-07-17 ENCOUNTER — Encounter (HOSPITAL_BASED_OUTPATIENT_CLINIC_OR_DEPARTMENT_OTHER): Payer: Self-pay | Admitting: Pharmacist Clinician (PhC)/ Clinical Pharmacy Specialist

## 2021-07-17 DIAGNOSIS — E785 Hyperlipidemia, unspecified: Secondary | ICD-10-CM | POA: Insufficient documentation

## 2021-07-17 NOTE — Assessment & Plan Note (Signed)
Patient with CAD, elevated coronary calcium score and LDL not at goal despite high intensity statin with ezetimibe. Reviewed options for lowering LDL cholesterol, including ezetimibe, PCSK-9 inhibitors, bempedoic acid and inclisiran.  Discussed mechanisms of action, dosing, side effects and potential decreases in LDL cholesterol.  Also reviewed cost information and potential options for patient assistance.  Answered all patient questions.  Based on this information, patient would like to review the options for a few days before making decision.  She has a Plan N Medicare supplement that would give Leqvio at no charge, and would also be eligible for [place] should she choose PCSK9 or bempedoic acid.  She will reach out to me with her decision in the next week or two

## 2021-07-17 NOTE — Progress Notes (Deleted)
HPI: Anne Gibbs is a 71 y.o. female patient referred to pharmacy clinic by Dr. Marland Kitchen to initiate weight loss therapy with GLP1-RA.  Most recent BMI ***.  Significant medical history:                  *** If diabetic and on insulin/sulfonylurea, can consider reducing dose to reduce risk of hypoglycemia  *** Follow-up visit  Assess % weight loss Assess adverse effects Missed doses  Current weight management medications:   Previously tried meds:   Current meds that may affect weight:   Baseline weight/BMI:   Insurance payor:   Diet:  -Breakfast: -Lunch: -Dinner: -Snacks: -Drinks:  Exercise:   Family History:   Confirmed patient not ***pregnant and no personal or family history of medullary thyroid carcinoma (MTC) or Multiple Endocrine Neoplasia syndrome type 2 (MEN 2).   Social History:   Labs: No results found for: HGBA1C  Wt Readings from Last 1 Encounters:  06/18/21 153 lb (69.4 kg)    BP Readings from Last 1 Encounters:  06/18/21 136/66   Pulse Readings from Last 1 Encounters:  06/18/21 (!) 56       Component Value Date/Time   CHOL 161 05/13/2021 0833   TRIG 162 (H) 05/13/2021 0833   HDL 44 05/13/2021 0833   CHOLHDL 3.7 05/13/2021 0833   LDLCALC 89 05/13/2021 0833    Past Medical History:  Diagnosis Date   High cholesterol     Current Outpatient Medications on File Prior to Visit  Medication Sig Dispense Refill   Acetaminophen (TYLENOL ARTHRITIS PAIN PO) Take 650 mg by mouth as needed.     aspirin EC 81 MG tablet Take 81 mg by mouth daily.     atorvastatin (LIPITOR) 80 MG tablet TAKE 1 TABLET(80 MG) BY MOUTH DAILY 90 tablet 2   ezetimibe (ZETIA) 10 MG tablet TAKE 1 TABLET(10 MG) BY MOUTH DAILY 90 tablet 1   metoprolol succinate (TOPROL-XL) 25 MG 24 hr tablet TAKE 1 TABLET(25 MG) BY MOUTH DAILY 90 tablet 3   Multiple Vitamin tablet Take 1 tablet by mouth daily.     nitroGLYCERIN (NITROSTAT) 0.4 MG SL tablet Place 1 tablet (0.4 mg  total) under the tongue every 5 (five) minutes as needed for chest pain. 90 tablet 3   No current facility-administered medications on file prior to visit.    No Known Allergies  No problem-specific Assessment & Plan notes found for this encounter.   Tommy Medal PharmD CPP [place]

## 2021-07-26 DIAGNOSIS — I2510 Atherosclerotic heart disease of native coronary artery without angina pectoris: Secondary | ICD-10-CM | POA: Diagnosis not present

## 2021-07-26 DIAGNOSIS — Z1211 Encounter for screening for malignant neoplasm of colon: Secondary | ICD-10-CM | POA: Diagnosis not present

## 2021-07-26 DIAGNOSIS — E782 Mixed hyperlipidemia: Secondary | ICD-10-CM | POA: Diagnosis not present

## 2021-07-26 DIAGNOSIS — Z85828 Personal history of other malignant neoplasm of skin: Secondary | ICD-10-CM | POA: Diagnosis not present

## 2021-07-26 DIAGNOSIS — Z0000 Encounter for general adult medical examination without abnormal findings: Secondary | ICD-10-CM | POA: Diagnosis not present

## 2021-07-26 DIAGNOSIS — K219 Gastro-esophageal reflux disease without esophagitis: Secondary | ICD-10-CM | POA: Diagnosis not present

## 2021-07-26 DIAGNOSIS — G4730 Sleep apnea, unspecified: Secondary | ICD-10-CM | POA: Diagnosis not present

## 2021-07-26 DIAGNOSIS — E559 Vitamin D deficiency, unspecified: Secondary | ICD-10-CM | POA: Diagnosis not present

## 2021-07-26 DIAGNOSIS — Z1389 Encounter for screening for other disorder: Secondary | ICD-10-CM | POA: Diagnosis not present

## 2021-07-26 DIAGNOSIS — M8588 Other specified disorders of bone density and structure, other site: Secondary | ICD-10-CM | POA: Diagnosis not present

## 2021-07-28 ENCOUNTER — Telehealth: Payer: Self-pay

## 2021-07-28 DIAGNOSIS — E785 Hyperlipidemia, unspecified: Secondary | ICD-10-CM

## 2021-07-28 NOTE — Telephone Encounter (Signed)
Called in and stated they would like to do the repatha. I will send this to kristin alvstad rph to make sure its ok for me to start a pa and order lipid/hepatic panel post 2 weeks. The pt voiced on the phone that she was working on applying for healthwell.  They gave me a [employer] card which repatha prefers  Id [id number]

## 2021-07-29 MED ORDER — REPATHA SURECLICK 140 MG/ML ~~LOC~~ SOAJ: 140.0000 mg | SUBCUTANEOUS | 11 refills | Status: DC

## 2021-07-29 NOTE — Telephone Encounter (Signed)
Called to notify pt that they were approved rxsent, instructed the pt to complete fasting labs   PATIENT [place] 06/28/2021   END DATE 06/27/2022   ASSISTANCE TYPE Co-pay   PAID $0.00   PENDING $0.00   BALANCE $2500.00 Pharmacy Card CARD NO. [id number]   CARD STATUS Active   BIN [id number]   PCN [id number]   PC GROUP [id number]   HELP DESK [phone]   PROVIDER PDMI   PROCESSOR PDMI

## 2021-07-29 NOTE — Addendum Note (Signed)
Addended by: Allean Found on: 07/29/2021 04:01 PM   Modules accepted: Orders

## 2021-07-29 NOTE — Telephone Encounter (Signed)
Pa submitted for repatha on cmm: Simara Lizotte (KeyWendi Snipes) - [id number] Repatha SureClick 140MG /ML auto-injectors Status: PA Request Created: February 23rd, 2023 Sent: February 23rd, 2023  Lipid and hepatic ordered and released.

## 2021-08-09 ENCOUNTER — Other Ambulatory Visit: Payer: Self-pay

## 2021-08-09 ENCOUNTER — Telehealth: Payer: Self-pay | Admitting: Cardiology

## 2021-08-09 ENCOUNTER — Encounter: Payer: Self-pay | Admitting: Pharmacist

## 2021-08-09 ENCOUNTER — Ambulatory Visit (INDEPENDENT_AMBULATORY_CARE_PROVIDER_SITE_OTHER): Payer: Medicare Other | Admitting: Pharmacist

## 2021-08-09 DIAGNOSIS — I208 Other forms of angina pectoris: Secondary | ICD-10-CM

## 2021-08-09 DIAGNOSIS — E785 Hyperlipidemia, unspecified: Secondary | ICD-10-CM | POA: Diagnosis not present

## 2021-08-09 NOTE — Telephone Encounter (Signed)
Pt c/o medication issue: ? ?1. Name of Medication: Evolocumab (REPATHA SURECLICK) 563 MG/ML SOAJ ? ?2. How are you currently taking this medication (dosage and times per day)? Inject 140 mg into the skin every 14 (fourteen) days. ? ?3. Are you having a reaction (difficulty breathing--STAT)? No  ? ?4. What is your medication issue? Patient is calling to see if she can get assistance with how take the [place] ?

## 2021-08-09 NOTE — Progress Notes (Signed)
Patient ID: [name]                 DOB: April 20, 1951                    MRN: [id number] ? ? ? ? ?HPI: ?Anne Gibbs is a 71 y.o. female patient referred to lipid clinic by Dr Harrell Gave. PMH is significant for HLD, HTN, and OSA. Patient recently prescribed Repatha but is neervous about injecting. ? ?Patient presents today with husband.  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 Patient has spent most of the weekend looking on the internet about Repatha and has made herself extremely anxious about self injections. ? ?Current Medications:  ?Atorvastatin 80 ?Repatha (has not started yet) ?Intolerances: N/A ?Risk Factors: CAD, HTN, HLD ?LDL goal: <70 ? ?Labs: TC 161, HDL 44, Trigs 162, LDL 89 (05/13/21 on atorvastatin 80) ? ?Past Medical History:  ?Diagnosis Date  ? High cholesterol   ? ? ?Current Outpatient Medications on File Prior to Visit  ?Medication Sig Dispense Refill  ? Acetaminophen (TYLENOL ARTHRITIS PAIN PO) Take 650 mg by mouth as needed.    ? aspirin EC 81 MG tablet Take 81 mg by mouth daily.    ? atorvastatin (LIPITOR) 80 MG tablet TAKE 1 TABLET(80 MG) BY MOUTH DAILY 90 tablet 2  ? Evolocumab (REPATHA SURECLICK) 536 MG/ML SOAJ Inject 140 mg into the skin every 14 (fourteen) days. 2 mL 11  ? ezetimibe (ZETIA) 10 MG tablet TAKE 1 TABLET(10 MG) BY MOUTH DAILY 90 tablet 1  ? metoprolol succinate (TOPROL-XL) 25 MG 24 hr tablet TAKE 1 TABLET(25 MG) BY MOUTH DAILY 90 tablet 3  ? Multiple Vitamin tablet Take 1 tablet by mouth daily.    ? nitroGLYCERIN (NITROSTAT) 0.4 MG SL tablet Place 1 tablet (0.4 mg total) under the tongue every 5 (five) minutes as needed for chest pain. 90 tablet 3  ? ?No current facility-administered medications on file prior to visit.  ? ? ?No Known Allergies ? ?Assessment/Plan: ? ?1. Hyperlipidemia - Patient's most recent LDL 89 which is above goal of <70.  Previously prescribed Repatha but has not injected yet due to anxiety.  Using demo pen, educated patient on storage, site  selection, administration, and possible adverse effects.  Patient was able to self inject her own Repatha pen into her abdomen on right side.  Advised to continue self injecting every 2 weeks and we will recheck cholesterol in 2-3 months.  Patient voiced understanding. ? ?Karren Cobble, PharmD, BCACP, [place], CPP ?[address], [address] ?[place], [place], [address] ?Phone: [phone], Fax: [phone]  ?  ?

## 2021-08-09 NOTE — Telephone Encounter (Signed)
Called and scheduled an appointment for pharmd visit to be taught how to inject at 4:15pm 08/09/21 ?

## 2021-08-13 ENCOUNTER — Telehealth (HOSPITAL_BASED_OUTPATIENT_CLINIC_OR_DEPARTMENT_OTHER): Payer: Self-pay | Admitting: Cardiology

## 2021-08-13 NOTE — Telephone Encounter (Signed)
No patient no longer needs to be on Zetia.  Has started Repatha now.  ?

## 2021-08-13 NOTE — Telephone Encounter (Signed)
Received fax from [facility] requesting refills for Ezetimibe. Is pt supposed to be taking Repatha, Atorvastatin, and Ezetimibe? ? ?Please advise if ok to fill Rx for Ezetimibe. Thank you! ?Routing to Linton Hall, Therapist, sports as well. ? ?

## 2021-08-16 NOTE — Telephone Encounter (Signed)
Left message to call back  

## 2021-08-18 NOTE — Telephone Encounter (Signed)
Pt updated and verbalized understanding.  

## 2021-08-20 ENCOUNTER — Other Ambulatory Visit: Payer: Self-pay | Admitting: Cardiology

## 2021-08-20 DIAGNOSIS — E782 Mixed hyperlipidemia: Secondary | ICD-10-CM

## 2021-08-20 DIAGNOSIS — I2510 Atherosclerotic heart disease of native coronary artery without angina pectoris: Secondary | ICD-10-CM

## 2021-08-20 DIAGNOSIS — I208 Other forms of angina pectoris: Secondary | ICD-10-CM

## 2021-10-14 ENCOUNTER — Other Ambulatory Visit: Payer: Self-pay | Admitting: Cardiology

## 2021-10-28 DIAGNOSIS — R42 Dizziness and giddiness: Secondary | ICD-10-CM | POA: Diagnosis not present

## 2021-10-28 DIAGNOSIS — H6123 Impacted cerumen, bilateral: Secondary | ICD-10-CM | POA: Diagnosis not present

## 2021-10-30 ENCOUNTER — Other Ambulatory Visit: Payer: Self-pay

## 2021-10-30 ENCOUNTER — Emergency Department (HOSPITAL_COMMUNITY)
Admission: EM | Admit: 2021-10-30 | Discharge: 2021-10-30 | Disposition: A | Discharge status: Home / Self Care | Payer: Medicare Other | Attending: Emergency Medicine | Admitting: Emergency Medicine

## 2021-10-30 ENCOUNTER — Emergency Department (HOSPITAL_COMMUNITY): Payer: Medicare Other

## 2021-10-30 ENCOUNTER — Encounter (HOSPITAL_COMMUNITY): Payer: Self-pay | Admitting: *Deleted

## 2021-10-30 DIAGNOSIS — Z7982 Long term (current) use of aspirin: Secondary | ICD-10-CM | POA: Insufficient documentation

## 2021-10-30 DIAGNOSIS — N390 Urinary tract infection, site not specified: Secondary | ICD-10-CM

## 2021-10-30 DIAGNOSIS — R42 Dizziness and giddiness: Secondary | ICD-10-CM | POA: Diagnosis not present

## 2021-10-30 HISTORY — DX: Atherosclerotic heart disease of native coronary artery without angina pectoris: I25.10

## 2021-10-30 LAB — URINALYSIS, ROUTINE W REFLEX MICROSCOPIC
Bilirubin Urine: NEGATIVE
Glucose, UA: NEGATIVE mg/dL
Hgb urine dipstick: NEGATIVE
Ketones, ur: NEGATIVE mg/dL
Nitrite: POSITIVE — AB
Protein, ur: NEGATIVE mg/dL
Specific Gravity, Urine: 1.005 (ref 1.005–1.030)
pH: 6 (ref 5.0–8.0)

## 2021-10-30 LAB — BASIC METABOLIC PANEL
Anion gap: 7 (ref 5–15)
BUN: 12 mg/dL (ref 8–23)
CO2: 26 mmol/L (ref 22–32)
Calcium: 9.3 mg/dL (ref 8.9–10.3)
Chloride: 108 mmol/L (ref 98–111)
Creatinine, Ser: 0.69 mg/dL (ref 0.44–1.00)
GFR, Estimated: >60 mL/min (ref >60)
Glucose, Bld: 110 mg/dL — ABNORMAL HIGH (ref 70–99)
Potassium: 3.6 mmol/L (ref 3.5–5.1)
Sodium: 141 mmol/L (ref 135–145)

## 2021-10-30 LAB — CBC
HCT: 39.7 % (ref 36.0–46.0)
Hemoglobin: 13.8 g/dL (ref 12.0–15.0)
MCH: 29.8 pg (ref 26.0–34.0)
MCHC: 34.8 g/dL (ref 30.0–36.0)
MCV: 85.7 fL (ref 80.0–100.0)
Platelets: 215 10*3/uL (ref 150–400)
RBC: 4.63 MIL/uL (ref 3.87–5.11)
RDW: 11.4 % — ABNORMAL LOW (ref 11.5–15.5)
WBC: 6.6 10*3/uL (ref 4.0–10.5)
nRBC: 0 % (ref 0.0–0.2)

## 2021-10-30 LAB — CBG MONITORING, ED: Glucose-Capillary: 104 mg/dL — ABNORMAL HIGH (ref 70–99)

## 2021-10-30 MED ORDER — SODIUM CHLORIDE 0.9 % IV BOLUS
1000.0000 mL | Freq: Once | INTRAVENOUS | Status: AC
  Administered 2021-10-30: 1000 mL via INTRAVENOUS

## 2021-10-30 MED ORDER — CEPHALEXIN 500 MG PO CAPS
500.0000 mg | ORAL_CAPSULE | Freq: Once | ORAL | Status: AC
  Administered 2021-10-30: 500 mg via ORAL
  Filled 2021-10-30: qty 1

## 2021-10-30 MED ORDER — CEPHALEXIN 250 MG PO CAPS: 250.0000 mg | ORAL_CAPSULE | Freq: Four times a day (QID) | ORAL | 0 refills | Status: DC

## 2021-10-30 NOTE — ED Notes (Signed)
Pt transported to CT ?

## 2021-10-30 NOTE — ED Notes (Signed)
Pt advises being unable to provide urine sample at this time, will check following fluid bolus.

## 2021-10-30 NOTE — Discharge Instructions (Signed)
It appears that your dizziness is from a urinary tract infection.  We started antibiotic, to treat this.  Make sure you are getting plenty of rest and be careful when standing to avoid falling.  Try to drink more fluids especially water, to 4 glasses a day.  Try to eat regularly.  Use Tylenol if needed for pain or fever.  Return here if needed for problems.

## 2021-10-30 NOTE — ED Provider Notes (Signed)
[facility] DEPT Provider Note   CSN: [id number] Arrival date & time: 10/30/21  1809     History {Add pertinent medical, surgical, social history, OB history to HPI:1} Chief Complaint  Patient presents with   Dizziness    Anne Gibbs is a 71 y.o. female.  HPI She reports onset of dizziness, for about a week which is worse with standing and has progression of symptoms.  She denies nausea or vomiting.  She denies dysuria or urinary frequency.  She denies shortness of breath or cough.  She gets dizzy every time she sits up or stands up.  She feels like she will fall.  She had a shingles shot shortly before onset of symptoms.  She has had similar symptoms in the past with various other problems.  Husband is with patient reports that she has been "tired for 2 weeks."  She saw her PCP yesterday and they "cleaned out my ears to help the dizziness."    Home Medications Prior to Admission medications   Medication Sig Start Date End Date Taking? Authorizing Provider  Acetaminophen (TYLENOL ARTHRITIS PAIN PO) Take 650 mg by mouth as needed.    [provider]  aspirin EC 81 MG tablet Take 81 mg by mouth daily.    [provider]  atorvastatin (LIPITOR) 80 MG tablet TAKE 1 TABLET(80 MG) BY MOUTH DAILY 08/20/21   Buford Dresser, MD  Evolocumab (REPATHA SURECLICK) 388 MG/ML SOAJ Inject 140 mg into the skin every 14 (fourteen) days. 07/29/21   Buford Dresser, MD  metoprolol succinate (TOPROL-XL) 25 MG 24 hr tablet TAKE 1 TABLET(25 MG) BY MOUTH DAILY 07/15/21   Buford Dresser, MD  Multiple Vitamin tablet Take 1 tablet by mouth daily.    [provider]  nitroGLYCERIN (NITROSTAT) 0.4 MG SL tablet Place 1 tablet (0.4 mg total) under the tongue every 5 (five) minutes as needed for chest pain. 11/11/20 11/09/21  Buford Dresser, MD      Allergies    Patient has no known allergies.    Review of Systems   Review of  Systems  Physical Exam Updated Vital Signs BP 138/64   Pulse (!) 57   Temp 97.9 F (36.6 C) (Oral)   Resp 16   Ht '5\' 2"'$  (1.575 m)   Wt 66.2 kg   SpO2 96%   BMI 26.70 kg/m  Physical Exam Vitals and nursing note reviewed.  Constitutional:      General: She is not in acute distress.    Appearance: She is well-developed. She is not ill-appearing, toxic-appearing or diaphoretic.  HENT:     Head: Normocephalic and atraumatic.     Right Ear: External ear normal.     Left Ear: External ear normal.  Eyes:     Conjunctiva/sclera: Conjunctivae normal.     Pupils: Pupils are equal, round, and reactive to light.  Neck:     Trachea: Phonation normal.  Cardiovascular:     Rate and Rhythm: Normal rate and regular rhythm.     Heart sounds: Normal heart sounds.  Pulmonary:     Effort: Pulmonary effort is normal.     Breath sounds: Normal breath sounds.  Abdominal:     Palpations: Abdomen is soft.     Tenderness: There is no abdominal tenderness.  Musculoskeletal:        General: Normal range of motion.     Cervical back: Normal range of motion and neck supple.  Skin:    General: Skin  is warm and dry.  Neurological:     Mental Status: She is alert and oriented to person, place, and time.     Cranial Nerves: No cranial nerve deficit.     Sensory: No sensory deficit.     Motor: No abnormal muscle tone.     Coordination: Coordination normal.  Psychiatric:        Behavior: Behavior normal.        Thought Content: Thought content normal.        Judgment: Judgment normal.    ED Results / Procedures / Treatments   Labs (all labs ordered are listed, but only abnormal results are displayed) Labs Reviewed  BASIC METABOLIC PANEL - Abnormal; Notable for the following components:      Result Value   Glucose, Bld 110 (*)    All other components within normal limits  CBC - Abnormal; Notable for the following components:   RDW 11.4 (*)    All other components within normal limits  CBG  MONITORING, ED - Abnormal; Notable for the following components:   Glucose-Capillary 104 (*)    All other components within normal limits  URINALYSIS, ROUTINE W REFLEX MICROSCOPIC  CBG MONITORING, ED    EKG EKG Interpretation  Date/Time:  Saturday Oct 30 2021 18:22:48 EDT Ventricular Rate:  57 PR Interval:  135 QRS Duration: 99 QT Interval:  395 QTC Calculation: 385 R Axis:   -6 Text Interpretation: Sinus rhythm Low voltage, precordial leads Borderline T abnormalities, anterior leads Since last tracing nonspecific T wave change is present Otherwise no significant change Confirmed by Daleen Bo [phone]) on 10/30/2021 6:27:24 PM  Radiology CT Head Wo Contrast  Result Date: 10/30/2021 CLINICAL DATA:  Dizziness, persistent/recurrent, cardiac or vascular cause suspected EXAM: CT HEAD WITHOUT CONTRAST TECHNIQUE: Contiguous axial images were obtained from the base of the skull through the vertex without intravenous contrast. RADIATION DOSE REDUCTION: This exam was performed according to the departmental dose-optimization program which includes automated exposure control, adjustment of the mA and/or kV according to patient size and/or use of iterative reconstruction technique. COMPARISON:  None Available. FINDINGS: Brain: No evidence of acute infarction, hemorrhage, hydrocephalus, extra-axial collection or mass lesion/mass effect. Vascular: No hyperdense vessel identified. Skull: No acute fracture. Sinuses/Orbits: Clear sinuses.  No acute orbital findings. Other: No mastoid effusions. IMPRESSION: No evidence of acute intracranial abnormality. Electronically Signed   By: Margaretha Sheffield M.D.   On: 10/30/2021 19:42    Procedures Procedures  {Document cardiac monitor, telemetry assessment procedure when appropriate:1}  Medications Ordered in ED Medications  sodium chloride 0.9 % bolus 1,000 mL (1,000 mLs Intravenous New Bag/Given 10/30/21 1936)    ED Course/ Medical Decision Making/ A&P                            Medical Decision Making Patient presenting with nonspecific dizziness, related to potential for multiple abnormalities including dehydration, UTI, pneumonia, anemia, metabolic disorders and nonspecific malaise.  Patient requires full work-up including brain imaging, urinalysis, blood work and chest x-ray.  Will evaluate for orthostasis with orthostatic vital signs and treat empirically with 1 L IV saline bolus.  Amount and/or Complexity of Data Reviewed Labs: ordered.     {Document critical care time when appropriate:1} {Document review of labs and clinical decision tools ie heart score, Chads2Vasc2 etc:1}  {Document your independent review of radiology images, and any outside records:1} {Document your discussion with family members, caretakers, and with consultants:1} {Document  social determinants of health affecting pt's care:1} {Document your decision making why or why not admission, treatments were needed:1} Final Clinical Impression(s) / ED Diagnoses Final diagnoses:  None    Rx / DC Orders ED Discharge Orders     None

## 2021-10-30 NOTE — ED Notes (Signed)
Pt return from XR.

## 2021-10-30 NOTE — ED Notes (Signed)
Pt transported to XR.  

## 2021-10-30 NOTE — ED Notes (Signed)
I provided reinforced discharge education based off of discharge instructions. Pt acknowledged and understood my education. Pt had no further questions/concerns for provider/myself. During my care of this pt, I informed RN of care, treatments and updates to pt condition. I had RN assist when required with treatment, and provide appropriate initial/necessary assessments.

## 2021-10-30 NOTE — ED Notes (Signed)
Pt ambulates with little to no assistance from staff.

## 2021-10-30 NOTE — ED Triage Notes (Signed)
Pt received her second shingles shot a week ago yesterday, has had episodes of dizziness since, starting getting worse yesterday. Movement or eyes open makes her more dizzy, closing eyes helps decrease the feeling. Husband states pt has been tired all the time over last couple of weeks.

## 2021-11-02 LAB — URINE CULTURE: Culture: >=100000 — AB

## 2021-11-03 ENCOUNTER — Telehealth: Payer: Self-pay | Admitting: Emergency Medicine

## 2021-11-03 NOTE — Telephone Encounter (Signed)
Post ED Visit - Positive Culture Follow-up  Culture report reviewed by antimicrobial stewardship pharmacist: [place] Team '[]'$  Elenor Quinones, Pharm.D. '[]'$  Heide Guile, Pharm.D., BCPS AQ-ID '[]'$  Parks Neptune, Pharm.D., BCPS '[]'$  Alycia Rossetti, Pharm.D., BCPS '[]'$  [place], Pharm.D., BCPS, AAHIVP '[]'$  Legrand Como, Pharm.D., BCPS, AAHIVP '[]'$  Salome Arnt, PharmD, BCPS '[]'$  Johnnette Gourd, PharmD, BCPS '[]'$  Hughes Better, PharmD, BCPS '[]'$  Leeroy Cha, PharmD '[]'$  Laqueta Linden, PharmD, BCPS '[]'$  Albertina Parr, PharmD  [facility] Team '[]'$  Leodis Sias, PharmD '[]'$  Lindell Spar, PharmD '[]'$  Royetta Asal, PharmD '[]'$  Graylin Shiver, Rph '[]'$  Rema Fendt) Glennon Mac, PharmD '[]'$  Arlyn Dunning, PharmD '[]'$  Netta Cedars, PharmD '[]'$  Dia Sitter, PharmD '[]'$  Leone Haven, PharmD '[]'$  Gretta Arab, PharmD '[]'$  Theodis Shove, PharmD '[]'$  Peggyann Juba, PharmD '[x]'$  Reuel Boom, PharmD   Positive urine culture Treated with cephalexin, organism sensitive to the same and no further patient follow-up is required at this time.  Hazle Nordmann 11/03/2021, 1:21 PM

## 2021-11-05 DIAGNOSIS — R42 Dizziness and giddiness: Secondary | ICD-10-CM | POA: Diagnosis not present

## 2021-11-05 DIAGNOSIS — N390 Urinary tract infection, site not specified: Secondary | ICD-10-CM | POA: Diagnosis not present

## 2021-11-22 DIAGNOSIS — R8290 Unspecified abnormal findings in urine: Secondary | ICD-10-CM | POA: Diagnosis not present

## 2021-11-22 DIAGNOSIS — R42 Dizziness and giddiness: Secondary | ICD-10-CM | POA: Diagnosis not present

## 2021-11-29 DIAGNOSIS — E785 Hyperlipidemia, unspecified: Secondary | ICD-10-CM | POA: Diagnosis not present

## 2021-11-30 LAB — LIPID PANEL
Chol/HDL Ratio: 2.1 ratio (ref 0.0–4.4)
Cholesterol, Total: 99 mg/dL — ABNORMAL LOW (ref 100–199)
HDL: 47 mg/dL (ref >39)
LDL Chol Calc (NIH): 26 mg/dL (ref 0–99)
Triglycerides: 160 mg/dL — ABNORMAL HIGH (ref 0–149)
VLDL Cholesterol Cal: 26 mg/dL (ref 5–40)

## 2021-12-16 ENCOUNTER — Ambulatory Visit (INDEPENDENT_AMBULATORY_CARE_PROVIDER_SITE_OTHER): Payer: Medicare Other | Admitting: Neurology

## 2021-12-16 ENCOUNTER — Encounter: Payer: Self-pay | Admitting: Neurology

## 2021-12-16 DIAGNOSIS — R42 Dizziness and giddiness: Secondary | ICD-10-CM | POA: Diagnosis not present

## 2021-12-16 MED ORDER — MECLIZINE HCL 12.5 MG PO TABS: 12.5000 mg | ORAL_TABLET | Freq: Three times a day (TID) | ORAL | 0 refills | Status: DC | PRN

## 2021-12-16 NOTE — Patient Instructions (Addendum)
Increase fluid intake  Trial of Meclizine 12.5 mg up to three time daily when symptomatic  Return as needed

## 2021-12-16 NOTE — Progress Notes (Signed)
[facility]  PATIENT: Anne Gibbs DOB: 07/30/1950  REQUESTING CLINICIAN: Antony Contras, MD HISTORY FROM: Patient  REASON FOR VISIT: Dizziness    HISTORICAL  CHIEF COMPLAINT:  Chief Complaint  Patient presents with   New Patient (Initial Visit)    NP/Paper/David Swayne MD [facility] [phone]/Recurrent vertigo Reports 2-3 episodes in the last few months     HISTORY OF PRESENT ILLNESS:  This is a 71 year old woman past medical history of CAD and hyperlipidemia who is presenting with recurrent episode of dizziness described as room spinning sensation.  Patient reports the first episode happened while she was visiting the [facility].  It was sudden and again described as room spinning sensation.  Since then, she has been having intermittent dizziness.  A year ago after having knee surgery, she had vertigo lasting for a week.  She reported last 2 episodes has been the worst meaning longer duration.  She has not tried any medication to help with the dizziness, has not tried meclizine.  She tried some maneuvers at home but unclear if they are helpful.  Currently she reported in the past four weeks, she has not she has been symptom-free.     OTHER MEDICAL CONDITIONS: Hyperlipidemia, CAD   REVIEW OF SYSTEMS: Full 14 system review of systems performed and negative with exception of: as noted in the HPI   ALLERGIES: No Known Allergies  HOME MEDICATIONS: Outpatient Medications Prior to Visit  Medication Sig Dispense Refill   Acetaminophen (TYLENOL ARTHRITIS PAIN PO) Take 650 mg by mouth as needed.     aspirin EC 81 MG tablet Take 81 mg by mouth daily.     atorvastatin (LIPITOR) 80 MG tablet TAKE 1 TABLET(80 MG) BY MOUTH DAILY 90 tablet 2   Evolocumab (REPATHA SURECLICK) 024 MG/ML SOAJ Inject 140 mg into the skin every 14 (fourteen) days. 2 mL 11   metoprolol succinate (TOPROL-XL) 25 MG 24 hr tablet TAKE 1 TABLET(25 MG) BY MOUTH DAILY 90 tablet 3    Multiple Vitamin tablet Take 1 tablet by mouth daily.     nitroGLYCERIN (NITROSTAT) 0.4 MG SL tablet Place 1 tablet (0.4 mg total) under the tongue every 5 (five) minutes as needed for chest pain. 90 tablet 3   cephALEXin (KEFLEX) 250 MG capsule Take 1 capsule (250 mg total) by mouth 4 (four) times daily. 28 capsule 0   No facility-administered medications prior to visit.    PAST MEDICAL HISTORY: Past Medical History:  Diagnosis Date   Coronary artery disease    High cholesterol     PAST SURGICAL HISTORY: Past Surgical History:  Procedure Laterality Date   BREAST BIOPSY Left 2017   benign    FAMILY HISTORY: History reviewed. No pertinent family history.  SOCIAL HISTORY: Social History   Socioeconomic History   Marital status: Married    Spouse name: Not on file   Number of children: Not on file   Years of education: Not on file   Highest education level: Not on file  Occupational History   Not on file  Tobacco Use   Smoking status: Never   Smokeless tobacco: Never  Vaping Use   Vaping Use: Never used  Substance and Sexual Activity   Alcohol use: No   Drug use: No   Sexual activity: Not on file  Other Topics Concern   Not on file  Social History Narrative   Not on file   Social Determinants of Health   Financial Resource Strain:  Not on file  Food Insecurity: Not on file  Transportation Needs: Not on file  Physical Activity: Not on file  Stress: Not on file  Social Connections: Not on file  Intimate Partner Violence: Not on file    PHYSICAL EXAM  GENERAL EXAM/CONSTITUTIONAL: Vitals: There were no vitals filed for this visit. There is no height or weight on file to calculate BMI. Wt Readings from Last 3 Encounters:  10/30/21 146 lb (66.2 kg)  06/18/21 153 lb (69.4 kg)  11/11/20 150 lb 9.6 oz (68.3 kg)   Patient is in no distress; well developed, nourished and groomed; neck is supple  EYES: Pupils round and reactive to light, Visual fields full to  confrontation, Extraocular movements intacts,   MUSCULOSKELETAL: Gait, strength, tone, movements noted in Neurologic exam below  NEUROLOGIC: MENTAL STATUS:      No data to display         awake, alert, oriented to person, place and time recent and remote memory intact normal attention and concentration language fluent, comprehension intact, naming intact fund of knowledge appropriate  CRANIAL NERVE:  2nd, 3rd, 4th, 6th - pupils equal and reactive to light, visual fields full to confrontation, extraocular muscles intact, no nystagmus 5th - facial sensation symmetric 7th - facial strength symmetric 8th - hearing intact 9th - palate elevates symmetrically, uvula midline 11th - shoulder shrug symmetric 12th - tongue protrusion midline  MOTOR:  normal bulk and tone, full strength in the BUE, BLE  SENSORY:  normal and symmetric to light touch, pinprick, temperature, vibration  COORDINATION:  finger-nose-finger, fine finger movements normal  REFLEXES:  deep tendon reflexes present and symmetric  GAIT/STATION:  normal   DIAGNOSTIC DATA (LABS, IMAGING, TESTING) - I reviewed patient records, labs, notes, testing and imaging myself where available.  Lab Results  Component Value Date   WBC 6.6 10/30/2021   HGB 13.8 10/30/2021   HCT 39.7 10/30/2021   MCV 85.7 10/30/2021   PLT 215 10/30/2021      Component Value Date/Time   NA 141 10/30/2021 1832   NA 141 09/17/2019 1115   K 3.6 10/30/2021 1832   CL 108 10/30/2021 1832   CO2 26 10/30/2021 1832   GLUCOSE 110 (H) 10/30/2021 1832   BUN 12 10/30/2021 1832   BUN 11 09/17/2019 1115   CREATININE 0.69 10/30/2021 1832   CALCIUM 9.3 10/30/2021 1832   GFRNONAA >60 10/30/2021 1832   GFRAA 109 09/17/2019 1115   Lab Results  Component Value Date   CHOL 99 (L) 11/29/2021   HDL 47 11/29/2021   LDLCALC 26 11/29/2021   TRIG 160 (H) 11/29/2021   CHOLHDL 2.1 11/29/2021   No results found for: "HGBA1C" No results found  for: "VITAMINB12" No results found for: "TSH"   CT Head 10/30/21 No evidence of acute intracranial abnormality.   ASSESSMENT AND PLAN  71 y.o. year old female with CAD and hyperlipidemia who is presenting with recurrent episodic vertigo described as room spinning sensation getting worse. Denies hearing loss, denies tinnitus. She had a recent head CT which was negative for any acute intracranial abnormalities. So far, she has not tried any medications such as meclizine for her symptoms. I informed patient there are no stroke, tumor or intracranial abnormalities that are causing the dizziness. I will prescribed her Meclizine 12.5 mg up to 3 times daily to use as needed if she is symptomatic in the future. Continue to follow up with PCP and return as needed.    1. Vertigo  Patient Instructions  Increase fluid intake  Trial of Meclizine 12.5 mg up to three time daily when symptomatic  Return as needed    No orders of the defined types were placed in this encounter.   Meds ordered this encounter  Medications   meclizine (ANTIVERT) 12.5 MG tablet    Sig: Take 1 tablet (12.5 mg total) by mouth 3 (three) times daily as needed for dizziness.    Dispense:  30 tablet    Refill:  0    Return if symptoms worsen or fail to improve.  I have spent a total of 45 minutes dedicated to this patient today, preparing to see patient, performing a medically appropriate examination and evaluation, ordering tests and/or medications and procedures, and counseling and educating the patient/family/caregiver; independently interpreting result and communicating results to the family/patient/caregiver; and documenting clinical information in the electronic medical record.   Alric Ran, MD 12/16/2021, 5:57 PM  [facility] [address] [place], [place] [address] [phone]

## 2022-01-05 ENCOUNTER — Telehealth: Payer: Self-pay | Admitting: Pharmacist Clinician (PhC)/ Clinical Pharmacy Specialist

## 2022-01-07 NOTE — Telephone Encounter (Signed)
Repatha PA approved to 06/05/22  Key [id number]

## 2022-01-26 NOTE — Progress Notes (Signed)
Cardiology Office Note:    Date:  01/28/2022   ID:  Zaydee, Aina [birth year], MRN [id number]  PCP:  Antony Contras, MD  Cardiologist:  Buford Dresser, MD  Referring MD: Antony Contras, MD   CC: follow up  History of Present Illness:    Anne Gibbs is a 71 y.o. female with a hx of three vessel CAD by CT, dyslipidemia, OSA not on CPAP who is seen for follow up. I initially saw her 07/16/19 as a new consult at the request of Antony Contras, MD for the evaluation and management of exertional dyspnea and exertional pain.  Cardiac history: bilateral scapular tightness and shortness of breath when walking up hill. CT cardiac with moderate calcified 3 vessel CAD, FFR with focal significant stenosis in LCX and LAD.  Stable angina improved with addition of metoprolol.  At her last appointment she had recovered well from a torn meniscus. She felt asymptomatic when walking at an appropriate pace. After a time, she would develop back pain. She also complained of atypical swelling in her fingers that morning. We discussed cholesterol optimization at length with [id number].  She called the office 07/28/2021 and reported that she was willing to proceed with Repatha. She was started on Repatha 140 mg q14d. She saw the pharmacist 08/09/2021 and was feeling anxious about starting Repatha. She had not yet taken her initial dose. With guidance she took the first dose at that visit. Her Zetia was discontinued.  Today: In the last 2-3 months she has noticed intermittent constipation for 3-5 days at a time. She is not sure if this could be a side effect of Repatha. In a normal day she is drinking about 4-5 bottles (16 oz) of water. Next Wednesday she is scheduled for a colonoscopy.  For exercise she goes to the gym twice a week. She tries to walk at least 5 days out of the week. Her route includes 2 hills. Usually she will make sure to walk early in the morning.  Recently she had issues with episodes of  vertigo. She has been given meclizine for treatment.  She denies any palpitations, chest pain, shortness of breath, or peripheral edema. No headaches, syncope, orthopnea, or PND.    Past Medical History:  Diagnosis Date   Coronary artery disease    High cholesterol     Past Surgical History:  Procedure Laterality Date   BREAST BIOPSY Left 2017   benign    Current Medications: Current Outpatient Medications on File Prior to Visit  Medication Sig   Acetaminophen (TYLENOL ARTHRITIS PAIN PO) Take 650 mg by mouth as needed.   aspirin EC 81 MG tablet Take 81 mg by mouth daily.   atorvastatin (LIPITOR) 80 MG tablet TAKE 1 TABLET(80 MG) BY MOUTH DAILY   Evolocumab (REPATHA SURECLICK) 381 MG/ML SOAJ Inject 140 mg into the skin every 14 (fourteen) days.   metoprolol succinate (TOPROL-XL) 25 MG 24 hr tablet TAKE 1 TABLET(25 MG) BY MOUTH DAILY   meclizine (ANTIVERT) 12.5 MG tablet Take 1 tablet (12.5 mg total) by mouth 3 (three) times daily as needed for dizziness. (Patient not taking: Reported on 01/28/2022)   Multiple Vitamin tablet Take 1 tablet by mouth daily. (Patient not taking: Reported on 01/28/2022)   nitroGLYCERIN (NITROSTAT) 0.4 MG SL tablet Place 1 tablet (0.4 mg total) under the tongue every 5 (five) minutes as needed for chest pain. (Patient not taking: Reported on 01/28/2022)   polyethylene glycol-electrolytes (NULYTELY) 420 g solution Take by mouth  as directed. (Patient not taking: Reported on 01/28/2022)   No current facility-administered medications on file prior to visit.     Allergies:   Patient has no known allergies.   Social History   Tobacco Use   Smoking status: Never   Smokeless tobacco: Never  Vaping Use   Vaping Use: Never used  Substance Use Topics   Alcohol use: No   Drug use: No    Family History: both mother and father died of MI, father was 82, mother was 1. Mother also had a history of blood clots. Sister has had recent surgery and complications,  which has been stressful. Two brothers and a sister have had MI as well.   ROS:   Please see the history of present illness.   (+) Constipation Additional pertinent ROS otherwise unremarkable.   EKGs/Labs/Other Studies Reviewed:    The following studies were reviewed today:  CTA cardiac 10/09/19 FINDINGS: Coronary calcium score: The patient's coronary artery calcium score is 966, which places the patient in the 97th percentile.   Coronary arteries: Normal coronary origins.  Right dominance.   Right Coronary Artery: Normal caliber vessel, gives rise to PDA. Focal calcified plaque in the proximal portion with 25-49% stenosis.   Left Main Coronary Artery: Normal caliber vessel. Calcified plaque at ostial LM and distal LM. Ostial plaque with trivial stenosis, but distal LM with 25-40% stenosis.   Left Anterior Descending Coronary Artery: Normal caliber vessel. Diffuse predominantly calcified plaque throughout proximal and mid portion. Most severe stenosis appears to be 50-69%. Gives rise to medium D1 and small D2 diagonal branches. D1 lumen not visible beyond most proximal portion.   Left Circumflex Artery: Normal caliber vessel. Focal proximal and mid portion calcified plaque. Most significant stenosis appears to be 25-40% at the takeoff from the left main, but there is a proximal portion of the LCX not well visualized except in one R-R interval (65%). Cannot exclude stenosis in this location. Gives rise to 1 OM branch.   Aorta: Normal size, 29 mm at the mid ascending aorta (level of the PA bifurcation) measured double oblique. Scattered calcifications. No dissection.   Aortic Valve: No calcifications. Trileaflet.   Other findings:  Normal pulmonary vein drainage into the left atrium.  Normal left atrial appendage without a thrombus.  Normal size of the pulmonary artery.   IMPRESSION: 1. Moderate CAD with 3 vessel disease, CADRADS = 3. CT FFR will be performed and  reported separately.  2. Coronary calcium score of 996. This was 97th percentile for age and sex matched control.  3. Normal coronary origin with right dominance.  FFR 10/09/2019: 1. Left Main:  No significant stenosis. FFR = 0.95   2. LAD: D1 small caliber and unable to be modeled. Proximal FFR = 0.82 after focal stenosis, Mid FFR = 0.75, Distal FFR = 0.69 3. LCX: Proximal FFR = 0.87, Distal FFR = 0.65 with discrete stenosis near mid portion of vessel. 4. RCA: No significant stenosis. Proximal FFR = 0.98, Mid FFR = 0.93. Distal caliber too small to be modeled.   IMPRESSION: 1. CT FFR analysis shows focal stenosis in LAD/LCX with initial FFR above 0.8 threshold, but as vessel continues, FFR becomes positive for significant flow limitation.    EKG:  EKG is personally reviewed.   01/28/2022:  NSR at 66 bpm 06/18/2021: sinus bradycardia at 56 bpm 11/11/2020: sinus bradycardia at 58 bpm 07/16/19: NSR  Recent Labs: 10/30/2021: BUN 12; Creatinine, Ser 0.69; Hemoglobin 13.8; Platelets 215; Potassium  3.6; Sodium 141   Recent Lipid Panel    Component Value Date/Time   CHOL 99 (L) 11/29/2021 0812   TRIG 160 (H) 11/29/2021 0812   HDL 47 11/29/2021 0812   CHOLHDL 2.1 11/29/2021 0812   LDLCALC 26 11/29/2021 0812    Physical Exam:    VS:  BP 136/74 (BP Location: Left Arm, Patient Position: Sitting, Cuff Size: Normal)   Pulse 66   Ht '5\' 1"'$  (1.549 m)   Wt 152 lb 8 oz (69.2 kg)   SpO2 95%   BMI 28.81 kg/m     Wt Readings from Last 3 Encounters:  01/28/22 152 lb 8 oz (69.2 kg)  10/30/21 146 lb (66.2 kg)  06/18/21 153 lb (69.4 kg)    GEN: Well nourished, well developed in no acute distress HEENT: Normal, moist mucous membranes NECK: No JVD CARDIAC: regular rhythm, normal S1 and S2, no rubs or gallops. 1/6 systolic murmur. VASCULAR: Radial and DP pulses 2+ bilaterally. No carotid bruits RESPIRATORY:  Clear to auscultation without rales, wheezing or rhonchi  ABDOMEN: Soft, non-tender,  non-distended MUSCULOSKELETAL:  Ambulates independently SKIN: Warm and dry, no edema NEUROLOGIC:  Alert and oriented x 3. No focal neuro deficits noted. PSYCHIATRIC:  Normal affect   ASSESSMENT:    1. Coronary artery disease due to calcified coronary lesion   2. Stable angina ([place])   3. Mixed hyperlipidemia   4. Family history of heart disease   5. Cardiac risk counseling      PLAN:    CAD, at least moderate, with heavy calcification in LAD and LCX especially and stenosis as noted by FFR -Exertional dyspnea and exertional back pain are her symptoms. She is doing well now with medical management -discussed stable and unstable angina, when to call 911. -continue atorvastatin 80 mg, repatha -continue aspirin 81 mg -continue metoprolol succinate 25 mg daily -Discussed nitrates, calcium channel blockers, and ranolazine as adjuncts if her angina worsens -discussed that if we cannot manage her symptoms with medications, we may pursue cath. Medical management is working for her thus far -has SL NG PRN and instructed on use -counseled on red flag warning signs that need 911 and immediate medical attention  Mixed hyperlipidemia: Per [facility],  Lipids 11/29/21: Tchol 99, HDL 47, LDL 26, TG 160 (on repatha) Lipids 05/13/21: Tchol 161, HDL 44, LDL 89, TG 162 Lipids 07/14/20: Tchol 155, HDL 55, LDL 79, TG 185 lipids 07/11/19: Tchol 168, HDL 40, LDL 64, TG 208 -continue atorvastatin, repatha  Cardiac risk counseling and prevention recommendations: has a family history of heart disease. -recommend heart healthy/Mediterranean diet, with whole grains, fruits, vegetable, fish, lean meats, nuts, and olive oil. Limit salt. -recommend moderate walking, 3-5 times/week for 30-50 minutes each session. Aim for at least 150 minutes.week. Goal should be pace of 3 miles/hours, or walking 1.5 miles in 30 minutes -recommend avoidance of tobacco products. Avoid excess alcohol.  Plan for follow up: 6 mos or sooner  PRN  Buford Dresser, MD, PhD [place]  [facility]    Medication Adjustments/Labs and Tests Ordered: Current medicines are reviewed at length with the patient today.  Concerns regarding medicines are outlined above.   Orders Placed This Encounter  Procedures   EKG 12-Lead   No orders of the defined types were placed in this encounter.  Patient Instructions  Medication Instructions:  Your Physician recommend you continue on your current medication as directed.    *If you need a refill on your cardiac medications before  your next appointment, please call your pharmacy*   Lab Work: None ordered today   Testing/Procedures: None ordered today   Follow-Up: At [facility] you and your health needs are our priority.  As part of our continuing mission to provide you with exceptional heart care, we have created designated Provider Care Teams.  These Care Teams include your primary Cardiologist (physician) and Advanced Practice Providers (APPs -  Physician Assistants and Nurse Practitioners) who all work together to provide you with the care you need, when you need it.  We recommend signing up for the patient portal called "MyChart".  Sign up information is provided on this After Visit Summary.  MyChart is used to connect with patients for Virtual Visits (Telemedicine).  Patients are able to view lab/test results, encounter notes, upcoming appointments, etc.  Non-urgent messages can be sent to your provider as well.   To learn more about what you can do with MyChart, go to [email or web address].    Your next appointment:   6 month(s)  The format for your next appointment:   In Person  Provider:   Buford Dresser, MD{         I,Mathew Stumpf,acting as a scribe for Buford Dresser, MD.,have documented all relevant documentation on the behalf of Buford Dresser, MD,as directed by  Buford Dresser, MD while in the presence of Buford Dresser, MD.  I, Buford Dresser, MD, have reviewed all documentation for this visit. The documentation on 01/28/22 for the exam, diagnosis, procedures, and orders are all accurate and complete.   Signed, Buford Dresser, MD PhD 01/28/2022    [place]

## 2022-01-28 ENCOUNTER — Ambulatory Visit (INDEPENDENT_AMBULATORY_CARE_PROVIDER_SITE_OTHER): Payer: Medicare Other | Admitting: Cardiology

## 2022-01-28 ENCOUNTER — Encounter (HOSPITAL_BASED_OUTPATIENT_CLINIC_OR_DEPARTMENT_OTHER): Payer: Self-pay | Admitting: Cardiology

## 2022-01-28 VITALS — BP 136/74 | HR 66 | Ht 61.0 in | Wt 152.5 lb

## 2022-01-28 DIAGNOSIS — I208 Other forms of angina pectoris: Secondary | ICD-10-CM | POA: Diagnosis not present

## 2022-01-28 DIAGNOSIS — Z7189 Other specified counseling: Secondary | ICD-10-CM | POA: Diagnosis not present

## 2022-01-28 DIAGNOSIS — Z8249 Family history of ischemic heart disease and other diseases of the circulatory system: Secondary | ICD-10-CM

## 2022-01-28 DIAGNOSIS — I2584 Coronary atherosclerosis due to calcified coronary lesion: Secondary | ICD-10-CM | POA: Diagnosis not present

## 2022-01-28 DIAGNOSIS — E782 Mixed hyperlipidemia: Secondary | ICD-10-CM | POA: Diagnosis not present

## 2022-01-28 DIAGNOSIS — I2510 Atherosclerotic heart disease of native coronary artery without angina pectoris: Secondary | ICD-10-CM | POA: Diagnosis not present

## 2022-01-28 NOTE — Patient Instructions (Signed)

## 2022-02-02 DIAGNOSIS — K5730 Diverticulosis of large intestine without perforation or abscess without bleeding: Secondary | ICD-10-CM | POA: Diagnosis not present

## 2022-02-02 DIAGNOSIS — Z1211 Encounter for screening for malignant neoplasm of colon: Secondary | ICD-10-CM | POA: Diagnosis not present

## 2022-02-02 DIAGNOSIS — K648 Other hemorrhoids: Secondary | ICD-10-CM | POA: Diagnosis not present

## 2022-02-08 DIAGNOSIS — U071 COVID-19: Secondary | ICD-10-CM | POA: Diagnosis not present

## 2022-02-27 ENCOUNTER — Encounter (HOSPITAL_BASED_OUTPATIENT_CLINIC_OR_DEPARTMENT_OTHER): Payer: Self-pay | Admitting: Cardiology

## 2022-03-02 DIAGNOSIS — L309 Dermatitis, unspecified: Secondary | ICD-10-CM | POA: Diagnosis not present

## 2022-03-10 DIAGNOSIS — Z23 Encounter for immunization: Secondary | ICD-10-CM | POA: Diagnosis not present

## 2022-04-25 ENCOUNTER — Other Ambulatory Visit (HOSPITAL_BASED_OUTPATIENT_CLINIC_OR_DEPARTMENT_OTHER): Payer: Self-pay | Admitting: Cardiology

## 2022-04-25 DIAGNOSIS — I2089 Other forms of angina pectoris: Secondary | ICD-10-CM

## 2022-04-25 DIAGNOSIS — I2510 Atherosclerotic heart disease of native coronary artery without angina pectoris: Secondary | ICD-10-CM

## 2022-04-25 NOTE — Telephone Encounter (Signed)
Rx(s) sent to pharmacy electronically.  

## 2022-06-07 ENCOUNTER — Telehealth: Payer: Self-pay | Admitting: Cardiology

## 2022-06-07 ENCOUNTER — Encounter: Payer: Self-pay | Admitting: Pharmacist

## 2022-06-07 NOTE — Telephone Encounter (Signed)
Spoke with patient and advised would forward to Pharm D who handles patient assistance

## 2022-06-07 NOTE — Telephone Encounter (Signed)
Pt c/o medication issue:  1. Name of Medication:  Evolocumab (REPATHA SURECLICK) 335 MG/ML SOAJ  2. How are you currently taking this medication (dosage and times per day)?   3. Are you having a reaction (difficulty breathing--STAT)?   4. What is your medication issue?   Patient is requesting assistance with reapplying for patient assistance for Repatha. Patient states her current enrollment expires on 06/27/22.

## 2022-06-07 NOTE — Telephone Encounter (Signed)
Will re-enroll pt in [employer] grant for 2024 year. Updated info has been sent to pt via MyChart, she was appreciative for the assistance.

## 2022-06-30 ENCOUNTER — Other Ambulatory Visit: Payer: Self-pay | Admitting: Cardiology

## 2022-06-30 NOTE — Telephone Encounter (Signed)
Rx(s) sent to pharmacy electronically.  

## 2022-07-06 DIAGNOSIS — R300 Dysuria: Secondary | ICD-10-CM | POA: Diagnosis not present

## 2022-07-06 DIAGNOSIS — R030 Elevated blood-pressure reading, without diagnosis of hypertension: Secondary | ICD-10-CM | POA: Diagnosis not present

## 2022-07-06 DIAGNOSIS — N898 Other specified noninflammatory disorders of vagina: Secondary | ICD-10-CM | POA: Diagnosis not present

## 2022-08-01 ENCOUNTER — Encounter (HOSPITAL_BASED_OUTPATIENT_CLINIC_OR_DEPARTMENT_OTHER): Payer: Self-pay | Admitting: Cardiology

## 2022-08-01 ENCOUNTER — Ambulatory Visit (HOSPITAL_BASED_OUTPATIENT_CLINIC_OR_DEPARTMENT_OTHER): Payer: Medicare HMO | Admitting: Cardiology

## 2022-08-01 VITALS — BP 132/60 | HR 59 | Ht 61.0 in | Wt 159.2 lb

## 2022-08-01 DIAGNOSIS — I2510 Atherosclerotic heart disease of native coronary artery without angina pectoris: Secondary | ICD-10-CM | POA: Diagnosis not present

## 2022-08-01 DIAGNOSIS — I2584 Coronary atherosclerosis due to calcified coronary lesion: Secondary | ICD-10-CM

## 2022-08-01 DIAGNOSIS — E782 Mixed hyperlipidemia: Secondary | ICD-10-CM | POA: Diagnosis not present

## 2022-08-01 DIAGNOSIS — Z7189 Other specified counseling: Secondary | ICD-10-CM

## 2022-08-01 DIAGNOSIS — Z8249 Family history of ischemic heart disease and other diseases of the circulatory system: Secondary | ICD-10-CM

## 2022-08-01 DIAGNOSIS — I2089 Other forms of angina pectoris: Secondary | ICD-10-CM

## 2022-08-01 DIAGNOSIS — I25119 Atherosclerotic heart disease of native coronary artery with unspecified angina pectoris: Secondary | ICD-10-CM

## 2022-08-01 NOTE — Patient Instructions (Addendum)
Medication Instructions:  Look into miralax (generic is fine--polyethylene glycol) as a stool softener. If the stools are soft but not moving, then you can try a pro-movement medication like dulcolax.  Labwork: NONE  Testing/Procedures: NONE  Follow-Up: [place] W NP   If you need a refill on your cardiac medications before your next appointment, please call your pharmacy.

## 2022-08-01 NOTE — Progress Notes (Signed)
Cardiology Office Note:    Date:  08/01/2022   ID:  Anne Gibbs 10/24/1950, MRN [id number]  PCP:  Antony Contras, MD  Cardiologist:  Buford Dresser, MD  Referring MD: Antony Contras, MD   CC: follow up  History of Present Illness:    Anne Gibbs is a 72 y.o. female with a hx of three vessel CAD by CT, dyslipidemia, OSA not on CPAP who is seen for follow up. I initially saw her 07/16/19 as a new consult at the request of Antony Contras, MD for the evaluation and management of exertional dyspnea and exertional pain.  Cardiac history: bilateral scapular tightness and shortness of breath when walking up hill. CT cardiac with moderate calcified 3 vessel CAD, FFR with focal significant stenosis in LCX and LAD.  Stable angina improved with addition of metoprolol.  Today, she states that 4 months ago she began having bowel movements about once a week. Aside from some straining, there is no associated pain or other symptoms. We discussed treatments such as using Miralax powder.  For exercise she participates in exercise classes and goes walking. If she is not pacing herself and walks too quickly she may experience chest discomfort. However, this is stable, no worse than when she was initially seen by cardiology. Episodes may occur 1-2 times a week. She has not needed to take her nitroglycerin.   She reports having brief, intermittent episodes of vertigo. Meclizine was prescribed and helped resolve her symptoms.  When waking up in the mornings she has crustiness over her eyes, and sometimes pruritus. She has been told this is drainage from her eyes. She is scheduled to see a specialist in June.  She denies any palpitations, shortness of breath, or peripheral edema. No headaches, syncope, orthopnea, or PND.   Past Medical History:  Diagnosis Date   Coronary artery disease    High cholesterol     Past Surgical History:  Procedure Laterality Date   BREAST BIOPSY Left 2017    benign    Current Medications: Current Outpatient Medications on File Prior to Visit  Medication Sig   Acetaminophen (TYLENOL ARTHRITIS PAIN PO) Take 650 mg by mouth as needed.   aspirin EC 81 MG tablet Take 81 mg by mouth daily.   atorvastatin (LIPITOR) 80 MG tablet TAKE 1 TABLET(80 MG) BY MOUTH DAILY   Evolocumab (REPATHA SURECLICK) XX123456 MG/ML SOAJ ADMINISTER 1 ML UNDER THE SKIN EVERY 14 DAYS   meclizine (ANTIVERT) 12.5 MG tablet Take 1 tablet (12.5 mg total) by mouth 3 (three) times daily as needed for dizziness.   metoprolol succinate (TOPROL-XL) 25 MG 24 hr tablet TAKE 1 TABLET EVERY DAY   Multiple Vitamin tablet Take 1 tablet by mouth daily.   nitroGLYCERIN (NITROSTAT) 0.4 MG SL tablet Place 1 tablet (0.4 mg total) under the tongue every 5 (five) minutes as needed for chest pain.   No current facility-administered medications on file prior to visit.     Allergies:   Patient has no known allergies.   Social History   Tobacco Use   Smoking status: Never   Smokeless tobacco: Never  Vaping Use   Vaping Use: Never used  Substance Use Topics   Alcohol use: No   Drug use: No    Family History: both mother and father died of MI, father was 1, mother was 95. Mother also had a history of blood clots. Sister has had recent surgery and complications, which has been stressful. Two brothers and a  sister have had MI as well.   ROS:   Please see the history of present illness.   (+) Exertional chest discomfort (+) Intermittent constipation Additional pertinent ROS otherwise unremarkable.   EKGs/Labs/Other Studies Reviewed:    The following studies were reviewed today:  CTA cardiac 10/09/19 FINDINGS: Coronary calcium score: The patient's coronary artery calcium score is 966, which places the patient in the 97th percentile.   Coronary arteries: Normal coronary origins.  Right dominance.   Right Coronary Artery: Normal caliber vessel, gives rise to PDA. Focal calcified plaque  in the proximal portion with 25-49% stenosis.   Left Main Coronary Artery: Normal caliber vessel. Calcified plaque at ostial LM and distal LM. Ostial plaque with trivial stenosis, but distal LM with 25-40% stenosis.   Left Anterior Descending Coronary Artery: Normal caliber vessel. Diffuse predominantly calcified plaque throughout proximal and mid portion. Most severe stenosis appears to be 50-69%. Gives rise to medium D1 and small D2 diagonal branches. D1 lumen not visible beyond most proximal portion.   Left Circumflex Artery: Normal caliber vessel. Focal proximal and mid portion calcified plaque. Most significant stenosis appears to be 25-40% at the takeoff from the left main, but there is a proximal portion of the LCX not well visualized except in one R-R interval (65%). Cannot exclude stenosis in this location. Gives rise to 1 OM branch.   Aorta: Normal size, 29 mm at the mid ascending aorta (level of the PA bifurcation) measured double oblique. Scattered calcifications. No dissection.   Aortic Valve: No calcifications. Trileaflet.   Other findings:  Normal pulmonary vein drainage into the left atrium.  Normal left atrial appendage without a thrombus.  Normal size of the pulmonary artery.   IMPRESSION: 1. Moderate CAD with 3 vessel disease, CADRADS = 3. CT FFR will be performed and reported separately.  2. Coronary calcium score of 996. This was 97th percentile for age and sex matched control.  3. Normal coronary origin with right dominance.  FFR 10/09/2019: 1. Left Main:  No significant stenosis. FFR = 0.95   2. LAD: D1 small caliber and unable to be modeled. Proximal FFR = 0.82 after focal stenosis, Mid FFR = 0.75, Distal FFR = 0.69 3. LCX: Proximal FFR = 0.87, Distal FFR = 0.65 with discrete stenosis near mid portion of vessel. 4. RCA: No significant stenosis. Proximal FFR = 0.98, Mid FFR = 0.93. Distal caliber too small to be modeled.   IMPRESSION: 1. CT FFR  analysis shows focal stenosis in LAD/LCX with initial FFR above 0.8 threshold, but as vessel continues, FFR becomes positive for significant flow limitation.    EKG:  EKG is personally reviewed.   08/01/2022:  sinus bradycardia at 59 bpm 01/28/2022:  NSR at 66 bpm 06/18/2021: sinus bradycardia at 56 bpm 11/11/2020: sinus bradycardia at 58 bpm 07/16/19: NSR  Recent Labs: 10/30/2021: BUN 12; Creatinine, Ser 0.69; Hemoglobin 13.8; Platelets 215; Potassium 3.6; Sodium 141   Recent Lipid Panel    Component Value Date/Time   CHOL 99 (L) 11/29/2021 0812   TRIG 160 (H) 11/29/2021 0812   HDL 47 11/29/2021 0812   CHOLHDL 2.1 11/29/2021 0812   LDLCALC 26 11/29/2021 0812    Physical Exam:    VS:  BP 132/60 (BP Location: Left Arm, Patient Position: Sitting, Cuff Size: Normal)   Pulse (!) 59   Ht '5\' 1"'$  (1.549 m)   Wt 159 lb 3.2 oz (72.2 kg)   BMI 30.08 kg/m     Wt Readings from  Last 3 Encounters:  08/01/22 159 lb 3.2 oz (72.2 kg)  01/28/22 152 lb 8 oz (69.2 kg)  10/30/21 146 lb (66.2 kg)    GEN: Well nourished, well developed in no acute distress HEENT: Normal, moist mucous membranes NECK: No JVD CARDIAC: regular rhythm, normal S1 and S2, no rubs or gallops. 1/6 systolic murmur. VASCULAR: Radial and DP pulses 2+ bilaterally. No carotid bruits RESPIRATORY:  Clear to auscultation without rales, wheezing or rhonchi  ABDOMEN: Soft, non-tender, non-distended MUSCULOSKELETAL:  Ambulates independently SKIN: Warm and dry, no edema NEUROLOGIC:  Alert and oriented x 3. No focal neuro deficits noted. PSYCHIATRIC:  Normal affect   ASSESSMENT:    1. Coronary artery disease due to calcified coronary lesion   2. Stable angina   3. Mixed hyperlipidemia   4. Family history of heart disease   5. Cardiac risk counseling   6. Counseling on health promotion and disease prevention     PLAN:    CAD, at least moderate, with heavy calcification in LAD and LCX especially and stenosis as noted by  FFR -Exertional dyspnea and exertional back pain are her symptoms. She is doing well now with medical management -discussed stable and unstable angina, when to call 911. -continue atorvastatin 80 mg, repatha -continue aspirin 81 mg -continue metoprolol succinate 25 mg daily -Discussed nitrates, calcium channel blockers, and ranolazine as adjuncts if her angina worsens -discussed that if we cannot manage her symptoms with medications, we may pursue cath. Medical management is working for her thus far -has SL NG PRN and instructed on use -counseled on red flag warning signs that need 911 and immediate medical attention  Mixed hyperlipidemia: Per [facility],  Lipids 11/29/21: Tchol 99, HDL 47, LDL 26, TG 160 (on repatha) Lipids 05/13/21: Tchol 161, HDL 44, LDL 89, TG 162 Lipids 07/14/20: Tchol 155, HDL 55, LDL 79, TG 185 lipids 07/11/19: Tchol 168, HDL 40, LDL 64, TG 208 -continue atorvastatin, repatha  Cardiac risk counseling and prevention recommendations: has a family history of heart disease. -recommend heart healthy/Mediterranean diet, with whole grains, fruits, vegetable, fish, lean meats, nuts, and olive oil. Limit salt. -recommend moderate walking, 3-5 times/week for 30-50 minutes each session. Aim for at least 150 minutes.week. Goal should be pace of 3 miles/hours, or walking 1.5 miles in 30 minutes -recommend avoidance of tobacco products. Avoid excess alcohol.  Plan for follow up: 1 year or sooner as needed.  Buford Dresser, MD, PhD [place]  [facility]    Medication Adjustments/Labs and Tests Ordered: Current medicines are reviewed at length with the patient today.  Concerns regarding medicines are outlined above.   Orders Placed This Encounter  Procedures   EKG 12-Lead   No orders of the defined types were placed in this encounter.  Patient Instructions  Medication Instructions:  Look into miralax (generic is fine--polyethylene glycol) as a stool softener. If the  stools are soft but not moving, then you can try a pro-movement medication like dulcolax.  Labwork: NONE  Testing/Procedures: NONE  Follow-Up: [place] W NP   If you need a refill on your cardiac medications before your next appointment, please call your pharmacy.     I,Mathew Stumpf,acting as a Education administrator for [employer], MD.,have documented all relevant documentation on the behalf of Buford Dresser, MD,as directed by  Buford Dresser, MD while in the presence of Buford Dresser, MD.  I, Buford Dresser, MD, have reviewed all documentation for this visit. The documentation on  08/01/22 for the exam, diagnosis, procedures, and orders are all accurate and complete.   Signed, Buford Dresser, MD PhD 08/01/2022    [place]

## 2022-08-02 DIAGNOSIS — E559 Vitamin D deficiency, unspecified: Secondary | ICD-10-CM | POA: Diagnosis not present

## 2022-08-02 DIAGNOSIS — K5900 Constipation, unspecified: Secondary | ICD-10-CM | POA: Diagnosis not present

## 2022-08-02 DIAGNOSIS — M8588 Other specified disorders of bone density and structure, other site: Secondary | ICD-10-CM | POA: Diagnosis not present

## 2022-08-02 DIAGNOSIS — G4730 Sleep apnea, unspecified: Secondary | ICD-10-CM | POA: Diagnosis not present

## 2022-08-02 DIAGNOSIS — Z1331 Encounter for screening for depression: Secondary | ICD-10-CM | POA: Diagnosis not present

## 2022-08-02 DIAGNOSIS — Z0000 Encounter for general adult medical examination without abnormal findings: Secondary | ICD-10-CM | POA: Diagnosis not present

## 2022-08-02 DIAGNOSIS — E782 Mixed hyperlipidemia: Secondary | ICD-10-CM | POA: Diagnosis not present

## 2022-08-02 DIAGNOSIS — I25119 Atherosclerotic heart disease of native coronary artery with unspecified angina pectoris: Secondary | ICD-10-CM | POA: Diagnosis not present

## 2022-08-02 DIAGNOSIS — K219 Gastro-esophageal reflux disease without esophagitis: Secondary | ICD-10-CM | POA: Diagnosis not present

## 2022-08-08 DIAGNOSIS — Z780 Asymptomatic menopausal state: Secondary | ICD-10-CM | POA: Diagnosis not present

## 2022-08-08 DIAGNOSIS — M8589 Other specified disorders of bone density and structure, multiple sites: Secondary | ICD-10-CM | POA: Diagnosis not present

## 2022-08-25 ENCOUNTER — Other Ambulatory Visit: Payer: Self-pay | Admitting: Cardiology

## 2022-08-25 DIAGNOSIS — I2510 Atherosclerotic heart disease of native coronary artery without angina pectoris: Secondary | ICD-10-CM

## 2022-08-25 DIAGNOSIS — I2089 Other forms of angina pectoris: Secondary | ICD-10-CM

## 2022-08-25 DIAGNOSIS — E782 Mixed hyperlipidemia: Secondary | ICD-10-CM

## 2022-08-25 NOTE — Telephone Encounter (Signed)
Rx(s) sent to pharmacy electronically.  

## 2022-12-02 DIAGNOSIS — H25813 Combined forms of age-related cataract, bilateral: Secondary | ICD-10-CM | POA: Diagnosis not present

## 2022-12-07 ENCOUNTER — Telehealth: Payer: Self-pay | Admitting: Cardiology

## 2022-12-07 NOTE — Telephone Encounter (Signed)
Hey looks like you were helping patient with this, please follow up!

## 2022-12-07 NOTE — Telephone Encounter (Signed)
Pt has active grant, shouldn't need anything. Called her, states she got an email that [facility] grant closed. Advised her it did close for new pts but she already has an active grant and it's good through January of 2025. She asks what happens if grant is closed still in January, advised pt she would be responsible for her copay for Repatha which is $46/fill based on what [facility] grant is currently being billed. She had no further questions and was appreciative for the call back.

## 2022-12-07 NOTE — Telephone Encounter (Signed)
Pt is requesting a regarding her grant with [employer]. Please advise

## 2023-01-01 ENCOUNTER — Other Ambulatory Visit: Payer: Self-pay | Admitting: Cardiology

## 2023-01-30 DIAGNOSIS — U071 COVID-19: Secondary | ICD-10-CM | POA: Diagnosis not present

## 2023-01-30 DIAGNOSIS — J069 Acute upper respiratory infection, unspecified: Secondary | ICD-10-CM | POA: Diagnosis not present

## 2023-02-13 DIAGNOSIS — Z23 Encounter for immunization: Secondary | ICD-10-CM | POA: Diagnosis not present

## 2023-03-24 ENCOUNTER — Other Ambulatory Visit: Payer: Self-pay | Admitting: Cardiology

## 2023-03-24 DIAGNOSIS — I2510 Atherosclerotic heart disease of native coronary artery without angina pectoris: Secondary | ICD-10-CM

## 2023-03-24 DIAGNOSIS — I2089 Other forms of angina pectoris: Secondary | ICD-10-CM

## 2023-03-28 ENCOUNTER — Telehealth: Payer: Self-pay | Admitting: Pharmacist

## 2023-03-28 NOTE — Telephone Encounter (Signed)
Patient called a few days ago and left name on VM but no message Aundra Millet, would you be able to call her back?

## 2023-03-28 NOTE — Telephone Encounter (Signed)
Called pt back, no answer, left message

## 2023-03-29 NOTE — Telephone Encounter (Signed)
Called pt again. Needs metoprolol refilled. Rx sent in on 10/18, pt aware.

## 2023-04-24 ENCOUNTER — Telehealth: Payer: Self-pay | Admitting: Cardiology

## 2023-04-24 DIAGNOSIS — R399 Unspecified symptoms and signs involving the genitourinary system: Secondary | ICD-10-CM | POA: Diagnosis not present

## 2023-04-24 DIAGNOSIS — K5900 Constipation, unspecified: Secondary | ICD-10-CM | POA: Diagnosis not present

## 2023-04-24 NOTE — Telephone Encounter (Signed)
Called patient, her Anne Gibbs exp on Jan 25,2025. It will not let us to re-enroll till Dec 22,2024. Patient to call us back on Dec 23 to remind. My calendar marked for that day too.

## 2023-04-24 NOTE — Telephone Encounter (Signed)
Pt would like a c/b from Pharmacist Supple regarding [employer]. Please advise

## 2023-05-19 ENCOUNTER — Other Ambulatory Visit: Payer: Self-pay | Admitting: Cardiology

## 2023-05-19 DIAGNOSIS — I2510 Atherosclerotic heart disease of native coronary artery without angina pectoris: Secondary | ICD-10-CM

## 2023-05-19 DIAGNOSIS — E782 Mixed hyperlipidemia: Secondary | ICD-10-CM

## 2023-05-19 DIAGNOSIS — I2089 Other forms of angina pectoris: Secondary | ICD-10-CM

## 2023-06-01 ENCOUNTER — Telehealth (HOSPITAL_BASED_OUTPATIENT_CLINIC_OR_DEPARTMENT_OTHER): Payer: Self-pay | Admitting: Cardiology

## 2023-06-01 MED ORDER — REPATHA SURECLICK 140 MG/ML ~~LOC~~ SOAJ: 140.0000 mg | SUBCUTANEOUS | 0 refills | Status: DC

## 2023-06-01 NOTE — Telephone Encounter (Signed)
*  STAT* If patient is at the pharmacy, call can be transferred to refill team.   1. Which medications need to be refilled? (please list name of each medication and dose if known) Repatha   2. Would you like to learn more about the convenience, safety, & potential cost savings by using the [facility]?     3. Are you open to using the [facility] (Type [facility].    4. Which pharmacy/location (including street and city if local pharmacy) is medication to be sent to? [facility] [address], [place]   5. Do they need a 30 day or 90 day supply? Patient said it might be too soon, please call it in for January

## 2023-07-13 ENCOUNTER — Other Ambulatory Visit: Payer: Self-pay | Admitting: Cardiology

## 2023-08-02 NOTE — Progress Notes (Signed)
 Cardiology Office Note:  .   Date:  08/04/2023  ID:  Anne Gibbs, DOB Oct 06, 1950, MRN [id number] PCP: Tally Joe, MD  [facility] Providers Cardiologist:  Jodelle Red, MD {  History of Present Illness: Anne Gibbs is a 73 y.o. female  with a hx of three vessel CAD by CT, dyslipidemia, OSA not on CPAP who is seen for follow up. I initially saw her 07/16/19 as a new consult at the request of Tally Joe, MD for the evaluation and management of exertional dyspnea and exertional pain.   Cardiac history: Presented in 2021 with bilateral scapular tightness and shortness of breath when walking up hill. CT cardiac with moderate calcified 3 vessel CAD, FFR with focal significant stenosis in LCX and LAD.  Stable angina improved with addition of metoprolol.  Today: Has questions about repatha. Having constipation, runny nose. Both are intermittent, no clear pattern. Also notes her voice cuts in/out with conversation, sometimes when she has runny nose. No clear associated URI with these events but notes intermittent mild URI symptoms. These are not related to the timing of injection of the repatha; these symptoms are more common in the morning and happen randomly. All are mild/nonlimiting symptoms.  No chest pain. Walks, goes to the gym twice a week. Has never needed nitroglycerin. Reviewed med list today.  Check BP at home sporadically. Slightly elevated today on initial check,   ROS: Denies chest pain, shortness of breath at rest or with normal exertion. No PND, orthopnea, LE edema or unexpected weight gain. No syncope or palpitations. ROS otherwise negative except as noted.   Studies Reviewed: Marland Kitchen    EKG:  EKG Interpretation Date/Time:  Friday August 04 2023 08:55:20 EST Ventricular Rate:  52 PR Interval:  134 QRS Duration:  82 QT Interval:  402 QTC Calculation: 373 R Axis:   2  Text Interpretation: Sinus bradycardia Confirmed by Jodelle Red  [phone]) on 08/04/2023 9:37:30 AM    Physical Exam:   VS:  BP 128/70   Pulse (!) 59   Ht 5\' 1"  (1.549 m)   Wt 148 lb 8 oz (67.4 kg)   SpO2 95%   BMI 28.06 kg/m    Wt Readings from Last 3 Encounters:  08/04/23 148 lb 8 oz (67.4 kg)  08/01/22 159 lb 3.2 oz (72.2 kg)  01/28/22 152 lb 8 oz (69.2 kg)    GEN: Well nourished, well developed in no acute distress HEENT: Normal, moist mucous membranes NECK: No JVD CARDIAC: regular rhythm, normal S1 and S2, no rubs or gallops. No murmur. VASCULAR: Radial and DP pulses 2+ bilaterally. No carotid bruits RESPIRATORY:  Clear to auscultation without rales, wheezing or rhonchi  ABDOMEN: Soft, non-tender, non-distended MUSCULOSKELETAL:  Ambulates independently SKIN: Warm and dry, no edema NEUROLOGIC:  Alert and oriented x 3. No focal neuro deficits noted. PSYCHIATRIC:  Normal affect    ASSESSMENT AND PLAN: .    CAD, at least moderate, with heavy calcification in LAD and LCX especially and stenosis as noted by FFR -Exertional dyspnea and exertional back pain are her symptoms. She is doing well now with medical management -discussed stable and unstable angina, when to call 911. -continue atorvastatin 80 mg, repatha 140 mcg Sq every 14 days -continue aspirin 81 mg -continue metoprolol succinate 25 mg daily -Discussed nitrates, calcium channel blockers, and ranolazine as adjuncts if her angina worsens -discussed that if we cannot manage her symptoms with medications, we may pursue cath. Medical management  is working for her thus far -has SL NG PRN and instructed on use -counseled on red flag warning signs that need 911 and immediate medical attention   Mixed hyperlipidemia: Per [facility],  Lipids 08/02/22: Tchol 87, HDL 46, LDL 14, TG 173 Lipids 11/29/21: Tchol 99, HDL 47, LDL 26, TG 160 (on repatha) Lipids 05/13/21: Tchol 161, HDL 44, LDL 89, TG 162 Lipids 07/14/20: Tchol 155, HDL 55, LDL 79, TG 185 lipids 07/11/19: Tchol 168, HDL 40, LDL 64, TG  208 -continue atorvastatin, repatha  Constipation: reviewed treatment today, not common with repatha. Could be beta blocker though this has been stable for a long  ?drug reactions to repatha: URI sx not clearly linked to dosing, but we discussed Leqvio if these become more related timing, more sever symptoms.  CV risk counseling and prevention -recommend heart healthy/Mediterranean diet, with whole grains, fruits, vegetable, fish, lean meats, nuts, and olive oil. Limit salt. -recommend moderate walking, 3-5 times/week for 30-50 minutes each session. Aim for at least 150 minutes.week. Goal should be pace of 3 miles/hours, or walking 1.5 miles in 30 minutes -recommend avoidance of tobacco products. Avoid excess alcohol.  Dispo: 1 year or sooner as needed  Signed, Jodelle Red, MD   Jodelle Red, MD, PhD, [facility]  [facility]  [place]  [facility] at [facility] at [facility] [address] [place], [place] [address] [phone]

## 2023-08-04 ENCOUNTER — Ambulatory Visit (HOSPITAL_BASED_OUTPATIENT_CLINIC_OR_DEPARTMENT_OTHER): Payer: Medicare HMO | Admitting: Cardiology

## 2023-08-04 ENCOUNTER — Encounter (HOSPITAL_BASED_OUTPATIENT_CLINIC_OR_DEPARTMENT_OTHER): Payer: Self-pay | Admitting: Cardiology

## 2023-08-04 VITALS — BP 128/70 | HR 59 | Ht 61.0 in | Wt 148.5 lb

## 2023-08-04 DIAGNOSIS — I2584 Coronary atherosclerosis due to calcified coronary lesion: Secondary | ICD-10-CM

## 2023-08-04 DIAGNOSIS — I2510 Atherosclerotic heart disease of native coronary artery without angina pectoris: Secondary | ICD-10-CM

## 2023-08-04 DIAGNOSIS — E782 Mixed hyperlipidemia: Secondary | ICD-10-CM | POA: Diagnosis not present

## 2023-08-04 DIAGNOSIS — Z8249 Family history of ischemic heart disease and other diseases of the circulatory system: Secondary | ICD-10-CM

## 2023-08-04 DIAGNOSIS — I25118 Atherosclerotic heart disease of native coronary artery with other forms of angina pectoris: Secondary | ICD-10-CM | POA: Diagnosis not present

## 2023-08-04 DIAGNOSIS — K5900 Constipation, unspecified: Secondary | ICD-10-CM

## 2023-08-04 DIAGNOSIS — I2089 Other forms of angina pectoris: Secondary | ICD-10-CM

## 2023-08-04 MED ORDER — NITROGLYCERIN 0.4 MG SL SUBL: 0.4000 mg | SUBLINGUAL_TABLET | SUBLINGUAL | 3 refills | Status: AC | PRN

## 2023-08-04 MED ORDER — REPATHA SURECLICK 140 MG/ML ~~LOC~~ SOAJ: 140.0000 mg | SUBCUTANEOUS | 11 refills | Status: DC

## 2023-08-04 NOTE — Patient Instructions (Addendum)
 For constipation: Level one: increase hydration and fiber (either in diet or Metamucil-type supplement) Level two: add Miralax (polyethylene glycol, generic ok) as needed or daily to above Level three: do the above (need a softener before a squeezer), then add senna or bisacodyl as needed (don't do daily, your body will become dependent)   Please let me know if you have any new concerns, but otherwise we will plan to follow up in 1 year.

## 2023-08-14 ENCOUNTER — Ambulatory Visit: Payer: Medicare Other | Admitting: Podiatry

## 2023-08-14 ENCOUNTER — Encounter: Payer: Self-pay | Admitting: Podiatry

## 2023-08-14 ENCOUNTER — Ambulatory Visit (INDEPENDENT_AMBULATORY_CARE_PROVIDER_SITE_OTHER)

## 2023-08-14 VITALS — Ht 61.0 in | Wt 148.5 lb

## 2023-08-14 DIAGNOSIS — S92811S Other fracture of right foot, sequela: Secondary | ICD-10-CM | POA: Diagnosis not present

## 2023-08-14 DIAGNOSIS — M2031 Hallux varus (acquired), right foot: Secondary | ICD-10-CM

## 2023-08-14 DIAGNOSIS — M2041 Other hammer toe(s) (acquired), right foot: Secondary | ICD-10-CM

## 2023-08-14 DIAGNOSIS — S93529S Sprain of metatarsophalangeal joint of unspecified toe(s), sequela: Secondary | ICD-10-CM

## 2023-08-14 NOTE — Patient Instructions (Signed)

## 2023-08-14 NOTE — Progress Notes (Signed)
  Subjective:  Patient ID: Anne Gibbs, female    DOB: [birth year],  MRN: [id number]  Chief Complaint  Patient presents with   Hammer Toe    Pt is here due to hammer foot on right foot she states it has been there for 3-4 years but the last couple of months she has notice when walking being a little off balance believes its due to hammertoe.    73 y.o. female presents with the above complaint. History confirmed with patient.   Objective:  Physical Exam: warm, good capillary refill, no trophic changes or ulcerative lesions, normal DP and PT pulses, normal sensory exam, and hallux varus deformity with very taut abductor hallucis muscle tendon, fully reducible with good range of motion of the MTP joint.   Radiographs: Multiple views x-ray of the right foot: Hallux varus deformity, bipartite versus fractured fibular sesamoid Assessment:   1. Acquired hallux varus of right foot   2. Turf toe, sequela      Plan:  Patient was evaluated and treated and all questions answered.  I reviewed her radiographs as well as my clinical exam findings with her.  We discussed the presence of the hallux varus deformity discussed that the deformity is very reducible and she has a very taut hallux abductor muscle belly.  Her x-ray shows a bipartite versus previous fracture of the fibular sesamoid with likely incompetence of the lateral soft tissue structures, I recommended MRI to evaluate if there is tearing of the [facility] and/or the lateral collateral ligaments, with the reducibility and good range of motion of the joint MRI will also be helpful in determining if fusion is necessary due to arthritis, if there are arthritic changes I discussed with her I would recommend arthrodesis if there are not then reconstruction may be possible.  I will see her back after the study for surgical planning. No follow-ups on file.

## 2023-08-23 ENCOUNTER — Encounter: Payer: Self-pay | Admitting: Podiatry

## 2023-08-28 ENCOUNTER — Ambulatory Visit
Admission: RE | Admit: 2023-08-28 | Discharge: 2023-08-28 | Disposition: A | Discharge status: Home / Self Care | Source: Ambulatory Visit | Attending: Podiatry | Admitting: Podiatry

## 2023-08-28 DIAGNOSIS — S92811S Other fracture of right foot, sequela: Secondary | ICD-10-CM

## 2023-08-28 DIAGNOSIS — M2031 Hallux varus (acquired), right foot: Secondary | ICD-10-CM | POA: Diagnosis not present

## 2023-08-28 DIAGNOSIS — S93529S Sprain of metatarsophalangeal joint of unspecified toe(s), sequela: Secondary | ICD-10-CM

## 2023-08-28 DIAGNOSIS — M19071 Primary osteoarthritis, right ankle and foot: Secondary | ICD-10-CM | POA: Diagnosis not present

## 2023-09-05 ENCOUNTER — Ambulatory Visit: Admitting: Podiatry

## 2023-09-05 DIAGNOSIS — E559 Vitamin D deficiency, unspecified: Secondary | ICD-10-CM | POA: Diagnosis not present

## 2023-09-05 DIAGNOSIS — Z0000 Encounter for general adult medical examination without abnormal findings: Secondary | ICD-10-CM | POA: Diagnosis not present

## 2023-09-05 DIAGNOSIS — K219 Gastro-esophageal reflux disease without esophagitis: Secondary | ICD-10-CM | POA: Diagnosis not present

## 2023-09-05 DIAGNOSIS — M2031 Hallux varus (acquired), right foot: Secondary | ICD-10-CM | POA: Diagnosis not present

## 2023-09-05 DIAGNOSIS — E782 Mixed hyperlipidemia: Secondary | ICD-10-CM | POA: Diagnosis not present

## 2023-09-05 DIAGNOSIS — G4730 Sleep apnea, unspecified: Secondary | ICD-10-CM | POA: Diagnosis not present

## 2023-09-05 DIAGNOSIS — M8588 Other specified disorders of bone density and structure, other site: Secondary | ICD-10-CM | POA: Diagnosis not present

## 2023-09-05 DIAGNOSIS — Z85828 Personal history of other malignant neoplasm of skin: Secondary | ICD-10-CM | POA: Diagnosis not present

## 2023-09-05 DIAGNOSIS — I25119 Atherosclerotic heart disease of native coronary artery with unspecified angina pectoris: Secondary | ICD-10-CM | POA: Diagnosis not present

## 2023-09-07 ENCOUNTER — Ambulatory Visit: Admitting: Podiatry

## 2023-09-07 ENCOUNTER — Encounter: Payer: Self-pay | Admitting: Podiatry

## 2023-09-07 DIAGNOSIS — M2031 Hallux varus (acquired), right foot: Secondary | ICD-10-CM | POA: Diagnosis not present

## 2023-09-07 DIAGNOSIS — S92811S Other fracture of right foot, sequela: Secondary | ICD-10-CM | POA: Diagnosis not present

## 2023-09-07 NOTE — Progress Notes (Signed)
  Subjective:  Patient ID: Anne Gibbs, female    DOB: December 10, 1950,  MRN: [id number]  Chief Complaint  Patient presents with   Foot Pain    mri results    73 y.o. female presents with the above complaint. History confirmed with patient.  She is doing well she completed the MRI.  Has not been particular painful but seems to be catching her other foot on the back of the heel when she walks it is not as bad when she is in a closed shoe.  Objective:  Physical Exam: warm, good capillary refill, no trophic changes or ulcerative lesions, normal DP and PT pulses, normal sensory exam, and hallux varus deformity with very taut abductor hallucis muscle tendon, fully reducible with good range of motion of the MTP joint.   Radiographs: Multiple views x-ray of the right foot: Hallux varus deformity, bipartite versus fractured fibular sesamoid   MRI completed on 08/28/2023, report not available but I did review the images and it does seem to show a previous avulsion or nonunion fracture of the lateral sesamoid.  Subchondral cyst formation of the dorsal lateral metatarsal head.  Mild to moderate degenerative changes noted. Assessment:   1. Acquired hallux varus of right foot   2. Closed fracture of sesamoid bone of right foot, sequela      Plan:  Patient was evaluated and treated and all questions answered.  I reviewed her MRI images and my impression with her unfortunately the report was not completed today and time for her visit.  I will let her know if the report has any disagreements with my impression.  We reviewed the results and my impression that there is already arthritic changes in the joint although they are mild to moderate.  We discussed that surgical rebalancing of the joint may offer improved alignment but likely would still have arthritis to deal with immediately or down the road.  We discussed the option of MTP fusion which would offer less chance of recurrence and treat the  arthritis simultaneously.  She will consider her options.  Currently in close shoes that is relatively tolerable but she is likely will elect proceed with surgery in the next months to years and she will let me know when she is ready to proceed.  Follow-up as needed.  No follow-ups on file.

## 2023-09-11 ENCOUNTER — Encounter: Payer: Self-pay | Admitting: Podiatry

## 2023-09-22 ENCOUNTER — Other Ambulatory Visit: Payer: Self-pay

## 2023-09-22 ENCOUNTER — Emergency Department (HOSPITAL_BASED_OUTPATIENT_CLINIC_OR_DEPARTMENT_OTHER)

## 2023-09-22 ENCOUNTER — Emergency Department (HOSPITAL_BASED_OUTPATIENT_CLINIC_OR_DEPARTMENT_OTHER)
Admission: EM | Admit: 2023-09-22 | Discharge: 2023-09-22 | Disposition: A | Discharge status: Home / Self Care | Attending: Emergency Medicine | Admitting: Emergency Medicine

## 2023-09-22 ENCOUNTER — Encounter (HOSPITAL_BASED_OUTPATIENT_CLINIC_OR_DEPARTMENT_OTHER): Payer: Self-pay

## 2023-09-22 DIAGNOSIS — J069 Acute upper respiratory infection, unspecified: Secondary | ICD-10-CM | POA: Insufficient documentation

## 2023-09-22 DIAGNOSIS — Z7982 Long term (current) use of aspirin: Secondary | ICD-10-CM | POA: Insufficient documentation

## 2023-09-22 DIAGNOSIS — R059 Cough, unspecified: Secondary | ICD-10-CM | POA: Diagnosis present

## 2023-09-22 DIAGNOSIS — R058 Other specified cough: Secondary | ICD-10-CM | POA: Diagnosis not present

## 2023-09-22 LAB — RESP PANEL BY RT-PCR (RSV, FLU A&B, COVID)  RVPGX2
Influenza A by PCR: NEGATIVE
Influenza B by PCR: NEGATIVE
Resp Syncytial Virus by PCR: NEGATIVE
SARS Coronavirus 2 by RT PCR: NEGATIVE

## 2023-09-22 MED ORDER — AZITHROMYCIN 250 MG PO TABS: ORAL_TABLET | ORAL | 0 refills | Status: AC

## 2023-09-22 MED ORDER — BENZONATATE 100 MG PO CAPS: 100.0000 mg | ORAL_CAPSULE | Freq: Three times a day (TID) | ORAL | 0 refills | Status: AC

## 2023-09-22 NOTE — Discharge Instructions (Signed)
 Your work-up in the ER today was reassuring for acute findings. You were swabbed for covid, flu, and RSV which were negative. However, your symptoms are still likely related to an upper respiratory infection. I recommend that you get plenty of rest and focus on symptomatic relief which includes Cepacol throat lozenges for sore throat, Mucinex for congestion, and tylenol /ibuprofen as needed for fevers and bodyaches. I have also given you a prescription for azithromycin  and Tessalon .  Z-Pak is an antibiotic to cover for any bacterial infection.  The Tessalon  is for cough.  I also recommend:  Increased fluid intake. Sports drinks offer valuable electrolytes, sugars, and fluids.  Breathing heated mist or steam (vaporizer or shower).  Eating chicken soup or other clear broths, and maintaining good nutrition.   Increasing usage of your inhaler if you have asthma.  Return to work when your temperature has returned to normal.  Gargle warm salt water and spit it out for sore throat. Take benadryl or Zyrtec to decrease sinus secretions.  Follow Up: Follow up with your primary care doctor in 5-7 days for recheck of ongoing symptoms.  Return to emergency department for emergent changing or worsening of symptoms.

## 2023-09-22 NOTE — ED Triage Notes (Signed)
 Since Tuesday has had URI. C/o productive cough with green sputum. Sinus pressure, runny nose, congestion, headache. Denies fevers.

## 2023-09-22 NOTE — ED Provider Notes (Signed)
  EMERGENCY DEPARTMENT AT [facility] Provider Note   CSN: [id number] Arrival date & time: 09/22/23  0920     History  Chief Complaint  Patient presents with   URI    Anne Gibbs is a 73 y.o. female.  Patient with history of CAD, hyperlipidemia presents today with complaints of cough and congestion.  She states that same began on Tuesday and has been persistent since then.  She has been taking Benadryl because she thought it was seasonal allergies but this has not had helping her.  States her husband was recently sick with similar symptoms and was seen and got a z pack which helped.  She denies any fevers or chills.  No chest pain or shortness of breath.  Does note she has been coughing up green mucus.   The history is provided by the patient. No language interpreter was used.  URI Presenting symptoms: congestion and cough        Home Medications Prior to Admission medications   Medication Sig Start Date End Date Taking? Authorizing Provider  Acetaminophen  (TYLENOL  ARTHRITIS PAIN PO) Take 650 mg by mouth as needed.    [provider]  aspirin EC 81 MG tablet Take 81 mg by mouth daily.    [provider]  atorvastatin  (LIPITOR) 80 MG tablet TAKE 1 TABLET(80 MG) BY MOUTH DAILY 05/19/23   Sheryle Donning, MD  Calcium  Carb-Cholecalciferol (CALCIUM  + D3 PO) Take by mouth.    [provider]  Evolocumab  (REPATHA  SURECLICK) 140 MG/ML SOAJ Inject 140 mg into the skin every 14 (fourteen) days. 08/04/23   Sheryle Donning, MD  metoprolol  succinate (TOPROL -XL) 25 MG 24 hr tablet TAKE 1 TABLET(25 MG) BY MOUTH DAILY 03/24/23   Sheryle Donning, MD  Multiple Vitamin tablet Take 1 tablet by mouth daily.    [provider]  nitroGLYCERIN  (NITROSTAT ) 0.4 MG SL tablet Place 1 tablet (0.4 mg total) under the tongue every 5 (five) minutes as needed for chest pain. 08/04/23 08/01/24  Sheryle Donning, MD       Allergies    Patient has no known allergies.    Review of Systems   Review of Systems  HENT:  Positive for congestion.   Respiratory:  Positive for cough.   All other systems reviewed and are negative.   Physical Exam Updated Vital Signs BP (!) 144/75 (BP Location: Right Arm)   Pulse 70   Temp 98.1 F (36.7 C) (Oral)   Resp 18   Ht 5\' 1"  (1.549 m)   Wt 64.9 kg   SpO2 97%   BMI 27.02 kg/m  Physical Exam Vitals and nursing note reviewed.  Constitutional:      General: She is not in acute distress.    Appearance: Normal appearance. She is normal weight. She is not ill-appearing, toxic-appearing or diaphoretic.  HENT:     Head: Normocephalic and atraumatic.  Cardiovascular:     Rate and Rhythm: Normal rate and regular rhythm.     Heart sounds: Normal heart sounds.  Pulmonary:     Effort: Pulmonary effort is normal. No respiratory distress.     Breath sounds: Normal breath sounds.  Abdominal:     General: Abdomen is flat.     Palpations: Abdomen is soft.     Tenderness: There is no abdominal tenderness.  Musculoskeletal:        General: Normal range of motion.     Cervical back: Normal range of motion.  Skin:  General: Skin is warm and dry.  Neurological:     General: No focal deficit present.     Mental Status: She is alert.  Psychiatric:        Mood and Affect: Mood normal.        Behavior: Behavior normal.     ED Results / Procedures / Treatments   Labs (all labs ordered are listed, but only abnormal results are displayed) Labs Reviewed  RESP PANEL BY RT-PCR (RSV, FLU A&B, COVID)  [id number]    EKG None  Radiology DG Chest 2 View Result Date: 09/22/2023 CLINICAL DATA:  Productive cough. EXAM: CHEST - 2 VIEW COMPARISON:  10/30/2021 FINDINGS: The lungs are clear without focal pneumonia, edema, pneumothorax or pleural effusion. The cardiopericardial silhouette is within normal limits for size. No acute bony abnormality. IMPRESSION: No active  cardiopulmonary disease. Electronically Signed   By: Donnal Fusi M.D.   On: 09/22/2023 11:38    Procedures Procedures    Medications Ordered in ED Medications - No data to display  ED Course/ Medical Decision Making/ A&P                                 Medical Decision Making Amount and/or Complexity of Data Reviewed Radiology: ordered.   Patient presents today with complaints of cough and congestion x 4 days.  They are afebrile, nontoxic-appearing, and in no acute distress with reassuring vital signs.  Physical exam reveals lung sounds clear to auscultation in all fields. CXR has resulted and reveals no acute findings. I have personally reviewed and interpreted this imaging and agree with radiology interpretation. Patient negative for COVID, flu, and RSV.  However, patient's symptoms likely due to URI, likely viral etiology.  Given patients age and symptoms, will cover with a z pack. Will also send for tessalon . Evaluation and diagnostic testing in the emergency department does not suggest an emergent condition requiring admission or immediate intervention beyond what has been performed at this time.  Plan for discharge with close PCP follow-up.  Patient is understanding and amenable with plan, educated on red flag symptoms that would prompt immediate return.  Patient discharged in stable condition.  Final Clinical Impression(s) / ED Diagnoses Final diagnoses:  Upper respiratory tract infection, unspecified type    Rx / DC Orders ED Discharge Orders          Ordered    azithromycin  (ZITHROMAX  Z-PAK) 250 MG tablet        09/22/23 1334    benzonatate  (TESSALON ) 100 MG capsule  Every 8 hours        09/22/23 1334          An After Visit Summary was printed and given to the patient.     Nachmen Mansel A, PA-C 09/22/23 1336    Tegeler, Marine Sia, MD 09/22/23 [phone]

## 2023-10-03 ENCOUNTER — Other Ambulatory Visit (HOSPITAL_BASED_OUTPATIENT_CLINIC_OR_DEPARTMENT_OTHER): Payer: Self-pay | Admitting: Cardiology

## 2023-10-03 DIAGNOSIS — I2510 Atherosclerotic heart disease of native coronary artery without angina pectoris: Secondary | ICD-10-CM

## 2023-10-03 DIAGNOSIS — E782 Mixed hyperlipidemia: Secondary | ICD-10-CM

## 2023-10-20 ENCOUNTER — Other Ambulatory Visit: Payer: Self-pay | Admitting: Family Medicine

## 2023-10-20 DIAGNOSIS — Z1231 Encounter for screening mammogram for malignant neoplasm of breast: Secondary | ICD-10-CM

## 2023-11-20 ENCOUNTER — Ambulatory Visit
Admission: RE | Admit: 2023-11-20 | Discharge: 2023-11-20 | Disposition: A | Discharge status: Home / Self Care | Source: Ambulatory Visit | Attending: Family Medicine | Admitting: Family Medicine

## 2023-11-20 DIAGNOSIS — Z1231 Encounter for screening mammogram for malignant neoplasm of breast: Secondary | ICD-10-CM | POA: Diagnosis not present

## 2023-12-04 DIAGNOSIS — I25119 Atherosclerotic heart disease of native coronary artery with unspecified angina pectoris: Secondary | ICD-10-CM | POA: Diagnosis not present

## 2023-12-04 DIAGNOSIS — E782 Mixed hyperlipidemia: Secondary | ICD-10-CM | POA: Diagnosis not present

## 2024-01-04 DIAGNOSIS — I25119 Atherosclerotic heart disease of native coronary artery with unspecified angina pectoris: Secondary | ICD-10-CM | POA: Diagnosis not present

## 2024-01-04 DIAGNOSIS — E782 Mixed hyperlipidemia: Secondary | ICD-10-CM | POA: Diagnosis not present

## 2024-01-30 DIAGNOSIS — R490 Dysphonia: Secondary | ICD-10-CM | POA: Diagnosis not present

## 2024-01-30 DIAGNOSIS — F439 Reaction to severe stress, unspecified: Secondary | ICD-10-CM | POA: Diagnosis not present

## 2024-01-30 DIAGNOSIS — R300 Dysuria: Secondary | ICD-10-CM | POA: Diagnosis not present

## 2024-01-30 DIAGNOSIS — Z7185 Encounter for immunization safety counseling: Secondary | ICD-10-CM | POA: Diagnosis not present

## 2024-01-31 ENCOUNTER — Encounter (INDEPENDENT_AMBULATORY_CARE_PROVIDER_SITE_OTHER): Payer: Self-pay

## 2024-02-04 DIAGNOSIS — E782 Mixed hyperlipidemia: Secondary | ICD-10-CM | POA: Diagnosis not present

## 2024-02-04 DIAGNOSIS — I25119 Atherosclerotic heart disease of native coronary artery with unspecified angina pectoris: Secondary | ICD-10-CM | POA: Diagnosis not present

## 2024-03-15 ENCOUNTER — Institutional Professional Consult (permissible substitution) (INDEPENDENT_AMBULATORY_CARE_PROVIDER_SITE_OTHER): Admitting: Otolaryngology

## 2024-03-18 ENCOUNTER — Encounter (INDEPENDENT_AMBULATORY_CARE_PROVIDER_SITE_OTHER): Payer: Self-pay | Admitting: Otolaryngology

## 2024-03-18 ENCOUNTER — Ambulatory Visit (INDEPENDENT_AMBULATORY_CARE_PROVIDER_SITE_OTHER): Admitting: Otolaryngology

## 2024-03-18 VITALS — BP 147/76 | HR 62 | Temp 98.5°F | Ht 61.0 in | Wt 145.0 lb

## 2024-03-18 DIAGNOSIS — K219 Gastro-esophageal reflux disease without esophagitis: Secondary | ICD-10-CM

## 2024-03-18 DIAGNOSIS — R0981 Nasal congestion: Secondary | ICD-10-CM

## 2024-03-18 DIAGNOSIS — J383 Other diseases of vocal cords: Secondary | ICD-10-CM

## 2024-03-18 DIAGNOSIS — J3089 Other allergic rhinitis: Secondary | ICD-10-CM

## 2024-03-18 DIAGNOSIS — R0982 Postnasal drip: Secondary | ICD-10-CM

## 2024-03-18 DIAGNOSIS — R490 Dysphonia: Secondary | ICD-10-CM

## 2024-03-18 MED ORDER — FLUTICASONE PROPIONATE 50 MCG/ACT NA SUSP: 2.0000 | Freq: Every day | NASAL | 6 refills | Status: AC

## 2024-03-18 MED ORDER — LEVOCETIRIZINE DIHYDROCHLORIDE 5 MG PO TABS: 5.0000 mg | ORAL_TABLET | Freq: Every evening | ORAL | 3 refills | Status: AC

## 2024-03-18 NOTE — Progress Notes (Signed)
 ENT CONSULT:  Reason for Consult: chronic hoarseness    HPI: Discussed the use of AI scribe software for clinical note transcription with the patient, who gave verbal consent to proceed.  History of Present Illness Anne Gibbs is a 73 year old female who presents with changes in her voice and hoarseness.  She began experiencing changes in her voice last year, characterized by intermittent hoarseness and occasional complete loss of voice. At times, her voice becomes raspy, described as sounding 'like a frog.' Initially, she attributed these changes to allergies, particularly in the spring, but the symptoms have persisted beyond allergy season. The hoarseness can last about a week at a time, though the pattern is variable.  No difficulty with swallowing or shortness of breath.  Records Reviewed:  Cards office visit 08/04/23 Anne Gibbs is a 73 y.o. female  with a hx of three vessel CAD by CT, dyslipidemia, OSA not on CPAP who is seen for follow up. I initially saw her 07/16/19 as a new consult at the request of Seabron Lenis, MD for the evaluation and management of exertional dyspnea and exertional pain.   Cardiac history: Presented in 2021 with bilateral scapular tightness and shortness of breath when walking up hill. CT cardiac with moderate calcified 3 vessel CAD, FFR with focal significant stenosis in LCX and LAD.  Stable angina improved with addition of metoprolol .   Today: Has questions about repatha . Having constipation, runny nose. Both are intermittent, no clear pattern. Also notes her voice cuts in/out with conversation, sometimes when she has runny nose. No clear associated URI with these events but notes intermittent mild URI symptoms. These are not related to the timing of injection of the repatha ; these symptoms are more common in the morning and happen randomly. All are mild/nonlimiting symptoms.   No chest pain. Walks, goes to the gym twice a week. Has never needed  nitroglycerin . Reviewed med list today.    Past Medical History:  Diagnosis Date   Coronary artery disease    High cholesterol     Past Surgical History:  Procedure Laterality Date   BREAST BIOPSY Left 2017   benign    History reviewed. No pertinent family history.  Social History:  reports that she has never smoked. She has never used smokeless tobacco. She reports that she does not drink alcohol and does not use drugs.  Allergies: No Known Allergies  Medications: I have reviewed the patient's current medications.  The PMH, PSH, Medications, Allergies, and SH were reviewed and updated.  ROS: Constitutional: Negative for fever, weight loss and weight gain. Cardiovascular: Negative for chest pain and dyspnea on exertion. Respiratory: Is not experiencing shortness of breath at rest. Gastrointestinal: Negative for nausea and vomiting. Neurological: Negative for headaches. Psychiatric: The patient is not nervous/anxious  Blood pressure (!) 147/76, pulse 62, temperature 98.5 F (36.9 C), height 5' 1 (1.549 m), weight 145 lb (65.8 kg), SpO2 94%. Body mass index is 27.4 kg/m.  PHYSICAL EXAM:  Exam: General: Well-developed, well-nourished Respiratory Respiratory effort: Equal inspiration and expiration without stridor Cardiovascular Peripheral Vascular: Warm extremities with equal color/perfusion Eyes: No nystagmus with equal extraocular motion bilaterally Neuro/Psych/Balance: Patient oriented to person, place, and time; Appropriate mood and affect; Gait is intact with no imbalance; Cranial nerves I-XII are intact Head and Face Inspection: Normocephalic and atraumatic without mass or lesion Palpation: Facial skeleton intact without bony stepoffs Salivary Glands: No mass or tenderness Facial Strength: Facial motility symmetric and full bilaterally ENT Pinna: External  ear intact and fully developed External canal: Canal is patent with intact skin Tympanic Membrane: Clear  and mobile External Nose: No scar or anatomic deformity Internal Nose: Septum is straight. No polyp, or purulence. Mucosal edema and erythema present.  Bilateral inferior turbinate hypertrophy.  Lips, Teeth, and gums: Mucosa and teeth intact and viable TMJ: No pain to palpation with full mobility Oral cavity/oropharynx: No erythema or exudate, no lesions present Nasopharynx: No mass or lesion with intact mucosa Hypopharynx: Intact mucosa without pooling of secretions Larynx Glottic: Full true vocal cord mobility without lesion or mass VF atrophy glottic insufficiency Supraglottic: Normal appearing epiglottis and AE folds Interarytenoid Space: Moderate pachydermia&edema Subglottic Space: Patent without lesion or edema Neck Neck and Trachea: Midline trachea without mass or lesion Thyroid : No mass or nodularity Lymphatics: No lymphadenopathy  Procedure:  Preoperative diagnosis: hoarseness  Postoperative diagnosis:   same + VF atrophy   Procedure: Flexible fiberoptic laryngoscopy with stroboscopy (68420)   Surgeon: Mima Cranmore, MD  Anesthesia: Topical lidocaine and Afrin  Complications: None  Condition is stable throughout exam  Indications and consent:   The patient presents to the clinic with hoarseness. All the risks, benefits, and potential complications were reviewed with the patient preoperatively and informed verbal consent was obtained.  Procedure: The patient was seated upright in the exam chair.   Topical lidocaine and Afrin were applied to the nasal cavity. After adequate anesthesia had occurred, the flexible telescope with strobe capabilities was passed into the nasal cavity. The nasopharynx was patent without mass or lesion. The scope was passed behind the soft palate and directed toward the base of tongue. The base of tongue was visualized and was symmetric with no apparent masses or abnormal appearing tissue. There were no signs of a mass or pooling of secretions  in the piriform sinuses. The supraglottic structures were normal.  The true vocal cords are mobile. The medial edges were bowed. Closure was incomplete and there was supraglottic compression. Periodicity present. The mucosal wave and amplitude were normal. There is moderate interarytenoid pachydermia and post cricoid edema. The laryngoscope was then slowly withdrawn and the patient tolerated the procedure well. There were no complications or blood loss.  Studies Reviewed: CXR 09/22/23 FINDINGS: The lungs are clear without focal pneumonia, edema, pneumothorax or pleural effusion. The cardiopericardial silhouette is within normal limits for size. No acute bony abnormality.   IMPRESSION: No active cardiopulmonary disease.  Assessment/Plan: Encounter Diagnoses  Name Primary?   Dysphonia Yes   Hoarseness    Age-related vocal fold atrophy    Glottic insufficiency    Chronic GERD    Environmental and seasonal allergies    Post-nasal drip    Chronic nasal congestion     Assessment and Plan Assessment & Plan  Age-related vocal cord atrophy with chronic dysphonia Chronic hoarseness and intermittent voice loss due to age-related vocal cord atrophy noted on strobe exam today with incomplete glottic closure.  - Discussed interventions including speech therapy for breath support, reflux, and postnasal drainage management. - Explained condition is not life-threatening and progresses slowly. - Voice function can be maintained with interventions; invasive procedures like vocal cord filler injections or implants are options if necessary. - Refer to speech therapy for breath support if desired. - Prescribed Xyzal at night for allergy management. - Prescribed Flonase nasal spray, up to twice daily. - Recommend lifestyle changes and Reflux Gourmet supplement after meals for reflux management. - Provide after visit summary with medication and lifestyle recommendations. - Advise to  call if  interested in speech therapy referral.  GERD LPR -  Reflux Gourmet after meals - diet and lifestyle changes to minimize GERD - Refer to [employer] blog for dietary and lifestyle modifications/reflux cook book  Chronic nasal congestion and post-nasal drainage Evidence of post-nasal drainage during flexible scope exam today, could be contributing to her sx - trial of Xyzal 5 mg daily and Flonase 2 puffs b/l nares BID - consider nasal saline rinses       Thank you for allowing me to participate in the care of this patient. Please do not hesitate to contact me with any questions or concerns.   Elena Larry, MD Otolaryngology [facility] Phone: [phone] Fax: [phone]    03/18/2024, 10:51 AM

## 2024-03-18 NOTE — Patient Instructions (Signed)

## 2024-03-26 DIAGNOSIS — M545 Low back pain, unspecified: Secondary | ICD-10-CM | POA: Diagnosis not present

## 2024-03-26 DIAGNOSIS — W19XXXA Unspecified fall, initial encounter: Secondary | ICD-10-CM | POA: Diagnosis not present

## 2024-03-30 ENCOUNTER — Other Ambulatory Visit: Payer: Self-pay | Admitting: Cardiology

## 2024-03-30 DIAGNOSIS — I2089 Other forms of angina pectoris: Secondary | ICD-10-CM

## 2024-03-30 DIAGNOSIS — I2510 Atherosclerotic heart disease of native coronary artery without angina pectoris: Secondary | ICD-10-CM

## 2024-04-03 DIAGNOSIS — M545 Low back pain, unspecified: Secondary | ICD-10-CM | POA: Diagnosis not present

## 2024-04-03 DIAGNOSIS — W108XXD Fall (on) (from) other stairs and steps, subsequent encounter: Secondary | ICD-10-CM | POA: Diagnosis not present

## 2024-06-24 ENCOUNTER — Telehealth: Payer: Self-pay | Admitting: Pharmacist

## 2024-06-24 ENCOUNTER — Telehealth: Payer: Self-pay | Admitting: Cardiology

## 2024-06-24 ENCOUNTER — Other Ambulatory Visit (HOSPITAL_COMMUNITY): Payer: Self-pay

## 2024-06-24 DIAGNOSIS — E782 Mixed hyperlipidemia: Secondary | ICD-10-CM

## 2024-06-24 DIAGNOSIS — I2510 Atherosclerotic heart disease of native coronary artery without angina pectoris: Secondary | ICD-10-CM

## 2024-06-24 MED ORDER — REPATHA SURECLICK 140 MG/ML ~~LOC~~ SOAJ
140.0000 mg | SUBCUTANEOUS | 3 refills | Status: AC
  Filled 2024-06-24: qty 6, 84d supply, fill #0

## 2024-06-24 NOTE — Telephone Encounter (Signed)
 Fax rev'cd from [facility] stating needs PA scanned to media

## 2024-06-24 NOTE — Telephone Encounter (Signed)
 Rx refill
